# Patient Record
Sex: Female | Born: 1990 | Race: White | Hispanic: Yes | Smoking: Never smoker
Health system: Southern US, Community
[De-identification: ages and names within clinical notes are randomized; demographics above are authoritative.]

## PROBLEM LIST (undated history)

## (undated) DIAGNOSIS — N946 Dysmenorrhea, unspecified: Secondary | ICD-10-CM

## (undated) DIAGNOSIS — N970 Female infertility associated with anovulation: Secondary | ICD-10-CM

## (undated) DIAGNOSIS — K5909 Other constipation: Secondary | ICD-10-CM

## (undated) DIAGNOSIS — E282 Polycystic ovarian syndrome: Secondary | ICD-10-CM

## (undated) DIAGNOSIS — E781 Pure hyperglyceridemia: Secondary | ICD-10-CM

## (undated) HISTORY — DX: Dysmenorrhea, unspecified: N94.6

## (undated) HISTORY — DX: Female infertility associated with anovulation: N97.0

## (undated) HISTORY — DX: Polycystic ovarian syndrome: E28.2

## (undated) HISTORY — DX: Other constipation: K59.09

## (undated) HISTORY — DX: Pure hyperglyceridemia: E78.1

---
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
## 2008-02-25 ENCOUNTER — Ambulatory Visit: Payer: Self-pay | Admitting: Family Medicine

## 2008-02-25 LAB — CONVERTED CEMR LAB
Bilirubin Urine: NEGATIVE
Nitrite: NEGATIVE
Protein, U semiquant: NEGATIVE
Urobilinogen, UA: 1
WBC Urine, dipstick: NEGATIVE

## 2008-02-26 LAB — CONVERTED CEMR LAB
Chlamydia, DNA Probe: NEGATIVE
GC Probe Amp, Genital: NEGATIVE

## 2008-02-29 ENCOUNTER — Encounter: Payer: Self-pay | Admitting: Family Medicine

## 2008-04-14 ENCOUNTER — Ambulatory Visit: Payer: Self-pay | Admitting: Family Medicine

## 2008-09-29 ENCOUNTER — Ambulatory Visit: Payer: Self-pay | Admitting: Family Medicine

## 2008-09-29 LAB — CONVERTED CEMR LAB
Glucose, Urine, Semiquant: NEGATIVE
Nitrite: NEGATIVE
Specific Gravity, Urine: <1.005
WBC Urine, dipstick: NEGATIVE
pH: 7

## 2008-09-30 ENCOUNTER — Encounter: Payer: Self-pay | Admitting: Family Medicine

## 2008-10-01 ENCOUNTER — Encounter: Payer: Self-pay | Admitting: Family Medicine

## 2009-01-12 ENCOUNTER — Ambulatory Visit: Payer: Self-pay | Admitting: Family Medicine

## 2009-01-12 LAB — CONVERTED CEMR LAB
Bilirubin Urine: NEGATIVE
Glucose, Urine, Semiquant: NEGATIVE
Ketones, urine, test strip: NEGATIVE
Protein, U semiquant: NEGATIVE
Specific Gravity, Urine: 1.02
Urobilinogen, UA: 0.2

## 2009-01-20 ENCOUNTER — Encounter: Payer: Self-pay | Admitting: Family Medicine

## 2009-01-20 ENCOUNTER — Ambulatory Visit: Payer: Self-pay | Admitting: Family Medicine

## 2009-01-20 LAB — CONVERTED CEMR LAB
Beta hcg, urine, semiquantitative: NEGATIVE
Rapid Strep: NEGATIVE

## 2009-03-11 HISTORY — PX: OTHER SURGICAL HISTORY: SHX169

## 2009-03-23 ENCOUNTER — Encounter: Payer: Self-pay | Admitting: Family Medicine

## 2009-03-23 ENCOUNTER — Ambulatory Visit: Payer: Self-pay | Admitting: Family Medicine

## 2009-03-23 LAB — CONVERTED CEMR LAB: Whiff Test: NEGATIVE

## 2009-04-11 ENCOUNTER — Emergency Department (HOSPITAL_COMMUNITY): Admission: EM | Admit: 2009-04-11 | Discharge: 2009-04-11 | Payer: Self-pay | Admitting: Emergency Medicine

## 2009-04-12 ENCOUNTER — Emergency Department (HOSPITAL_COMMUNITY): Admission: EM | Admit: 2009-04-12 | Discharge: 2009-04-12 | Payer: Self-pay | Admitting: Emergency Medicine

## 2009-04-27 ENCOUNTER — Ambulatory Visit: Payer: Self-pay | Admitting: Family Medicine

## 2009-05-15 ENCOUNTER — Ambulatory Visit: Payer: Self-pay | Admitting: Family Medicine

## 2009-05-15 LAB — CONVERTED CEMR LAB: Beta hcg, urine, semiquantitative: NEGATIVE

## 2009-07-14 ENCOUNTER — Ambulatory Visit: Payer: Self-pay | Admitting: Family Medicine

## 2009-07-14 DIAGNOSIS — N912 Amenorrhea, unspecified: Secondary | ICD-10-CM | POA: Insufficient documentation

## 2009-07-14 LAB — CONVERTED CEMR LAB
BUN: 10 mg/dL (ref 6–23)
Beta hcg, urine, semiquantitative: NEGATIVE
Chloride: 104 meq/L (ref 96–112)
Creatinine, Ser: 0.57 mg/dL (ref 0.40–1.20)
Glucose, Bld: 95 mg/dL (ref 70–99)
HCT: 42.6 % (ref 36.0–46.0)
Hemoglobin: 14.2 g/dL (ref 12.0–15.0)
MCHC: 33.3 g/dL (ref 30.0–36.0)
Potassium: 4.1 meq/L (ref 3.5–5.3)
RBC: 4.88 M/uL (ref 3.87–5.11)
RDW: 13.2 % (ref 11.5–15.5)

## 2009-07-15 ENCOUNTER — Encounter: Payer: Self-pay | Admitting: Family Medicine

## 2009-07-24 ENCOUNTER — Ambulatory Visit: Payer: Self-pay | Admitting: Family Medicine

## 2009-07-24 ENCOUNTER — Encounter: Payer: Self-pay | Admitting: Family Medicine

## 2009-08-11 ENCOUNTER — Ambulatory Visit: Payer: Self-pay | Admitting: Family Medicine

## 2009-08-11 DIAGNOSIS — N943 Premenstrual tension syndrome: Secondary | ICD-10-CM | POA: Insufficient documentation

## 2009-08-28 ENCOUNTER — Encounter: Payer: Self-pay | Admitting: *Deleted

## 2009-08-31 ENCOUNTER — Ambulatory Visit: Payer: Self-pay | Admitting: Family Medicine

## 2009-08-31 LAB — CONVERTED CEMR LAB
Blood in Urine, dipstick: NEGATIVE
Chlamydia, DNA Probe: NEGATIVE
Glucose, Urine, Semiquant: NEGATIVE
Ketones, urine, test strip: NEGATIVE
Protein, U semiquant: NEGATIVE
Specific Gravity, Urine: 1.015
Whiff Test: NEGATIVE
pH: 7

## 2009-09-02 ENCOUNTER — Telehealth: Payer: Self-pay | Admitting: Family Medicine

## 2009-10-19 ENCOUNTER — Ambulatory Visit: Payer: Self-pay | Admitting: Family Medicine

## 2009-11-30 ENCOUNTER — Ambulatory Visit: Payer: Self-pay | Admitting: Family Medicine

## 2010-02-09 ENCOUNTER — Ambulatory Visit: Payer: Self-pay | Admitting: Family Medicine

## 2010-02-09 DIAGNOSIS — R351 Nocturia: Secondary | ICD-10-CM

## 2010-02-09 LAB — CONVERTED CEMR LAB
FSH: 6 milliintl units/mL
Prolactin: 2.6 ng/mL
TSH: 0.612 microintl units/mL (ref 0.350–4.500)

## 2010-02-10 ENCOUNTER — Ambulatory Visit (HOSPITAL_COMMUNITY): Admission: RE | Admit: 2010-02-10 | Discharge: 2010-02-10 | Payer: Self-pay | Admitting: Family Medicine

## 2010-02-12 ENCOUNTER — Encounter: Payer: Self-pay | Admitting: Family Medicine

## 2010-02-12 LAB — CONVERTED CEMR LAB: Pap Smear: NEGATIVE

## 2010-05-12 NOTE — Assessment & Plan Note (Signed)
Summary: F/U/KH   Vital Signs:  Patient profile:   20 year old female Menstrual status:  irregular Height:      60.75 inches Weight:      126 pounds BMI:     24.09 Temp:     98.0 degrees F oral Pulse rate:   69 / minute BP sitting:   103 / 66  (left arm) Cuff size:   regular  Vitals Entered By: Tessie Fass CMA (Aug 11, 2009 10:55 AM) CC: F/U   Primary Care Provider:  Zachery Dauer MD  CC:  F/U.  History of Present Illness: Visit conducted in [place].  Traci Porter remarks that she is much better from the headache and cold symptoms for which she was seen here recently.  At present she wishes to discuss premenstrual pain that comeson about 1 week prior to menses.  LMP April 22, lasted its usual 3 days (one day heavy flow, then lighter).  Prior to that menses, penultimate menses was Jan 25th.  Usually her menses are very irregular. Has always had a lot of cramping and pelvic pain in the days prior to menses, which resolve after first day of menses.  She reprts that her mood is better than at last visit, when her PHQ-9 score was a 5.     Current Medications (verified): 1)  Midrin 325-65-100 Mg Caps (Apap-Isometheptene-Dichloral) .... Tomar 1 -2 Capsules Every 4 Hours As Needed For Headache. 2)  Naproxen 500 Mg Tabs (Naproxen) .... Sig: Take 1 Tab By Mouth Every 12 Hours As Directed Instructions in Spanish  Allergies (verified): No Known Drug Allergies  Physical Exam  General:  Well-developed,well-nourished,in no acute distress; alert,appropriate and cooperative throughout examination Ears:  External ear exam shows no significant lesions or deformities.  Otoscopic examination reveals clear canals, tympanic membranes are intact bilaterally without bulging, retraction, inflammation or discharge. Hearing is grossly normal bilaterally. Nose:  External nasal examination shows no deformity or inflammation. Nasal mucosa are pink and moist without lesions or exudates. Mouth:  Oral mucosa and  oropharynx without lesions or exudates.  Teeth in good repair. Neck:  No deformities, masses, or tenderness noted.   Impression & Recommendations:  Problem # 1:  PREMENSTRUAL DYSPHORIC SYNDROME (ICD-625.4)  Discussed use of NSAIDS for the week prior to onset of menses.  She may begin to take at first sign of premenstrual cramping and pain.  Stop taking after first day of menses.  She agrees with this plan, will call if not improved after trying for 2 cycles.  Also to consider SSRI if NSAIDs not helpful, she is amenable to this idea and will call to discuss if needed.   The following medications were removed from the medication list:    Ultracet 37.5-325 Mg Tabs (Tramadol-acetaminophen) .Marland Kitchen... Take 1 -2 tabs every 4 hr as needed for severe headache Her updated medication list for this problem includes:    Midrin 325-65-100 Mg Caps (Apap-isometheptene-dichloral) .Marland Kitchen... Tomar 1 -2 capsules every 4 hours as needed for headache.    Naproxen 500 Mg Tabs (Naproxen) ..... Sig: take 1 tab by mouth every 12 hours as directed instructions in spanish  Orders: [facility]- Est Level  3 ([address]295)  Complete Medication List: 1)  Midrin 325-65-100 Mg Caps (Apap-isometheptene-dichloral) .... Tomar 1 -2 capsules every 4 hours as needed for headache. 2)  Naproxen 500 Mg Tabs (Naproxen) .... Sig: take 1 tab by mouth every 12 hours as directed instructions in spanish  Patient Instructions: 1)  Fue un placer verle  hoy.  2)  Mande' una receta para Naproxen 500mg , tome una tableta cada 12 horas comenzando en el primer dia de dolor de vientre antes de caer la menstruacion. Siga tomandola Designer, multimedia dia de la menstruacion.  Tomela con algo de comer.  Tome muchos liquidos con la medicina tambien. 3)  Si no se le mejora, por favor llame y podemos considerar otras alternativas.  Prescriptions: NAPROXEN 500 MG TABS (NAPROXEN) SIG: Take 1 tab by mouth every 12 hours as directed Instructions in Spanish  #60 x 2    Entered and Authorized by:   Paula Compton MD   Signed by:   Paula Compton MD on 08/11/2009   Method used:   Electronically to        [facility] [phone]* (retail)       [address]       [place], [place]  [address]       Ph: [phone]       Fax: [phone]   RxID:   [id number]

## 2010-05-12 NOTE — Letter (Signed)
Summary: Generic Letter  [facility]  [address]   [place], [place] [address]   Phone: [phone]  Fax: [phone]    07/15/2009  [facility] [address] [place], [place]  [address]  Iantha Fallen,   Espero que esta carta le encuentre bien.  Escribo para decirle que todos los laboratorios que hicimos ayer salieron normales.    Por favor hagame contacto con cualquier pregunta o problema.     Sinceramente,     Paula Compton MD  Appended Document: Generic Letter mailed.

## 2010-05-12 NOTE — Assessment & Plan Note (Signed)
Summary: PE,PAP, irreg menses/mj   Vital Signs:  Patient profile:   20 year old female Menstrual status:  irregular Height:      60.75 inches Weight:      134.2 pounds BMI:     25.66 Pulse rate:   72 / minute BP sitting:   109 / 71  (right arm)  Vitals Entered By: Arlyss Repress CMA, (February 09, 2010 1:34 PM) CC: physical with pap Is Patient Diabetic? No Pain Assessment Patient in pain? no        CC:  physical with pap.  Habits & Providers  Alcohol-Tobacco-Diet     Tobacco Status: never  Allergies: No Known Drug Allergies   Complete Medication List: 1)  Miralax Powd (Polyethylene glycol 3350) .... Take 1 scoop (17 g) a day prn constipation. (spanish instructions) 2)  Multivitamins Tabs (Multiple vitamin) .... Take one tablet daily  Other Orders: Pap Smear-[facility] ([id number]) [facility]- Est Level  3 ([address]) U Preg-[facility] ([address]) A1C-[facility] ([address]) FSH-[facility] ([id number]) TSH-[facility] [phone]) Prolactin-[facility] [phone]) Ultrasound (Ultrasound)  Patient Instructions: 1)  Voy a contactarle con los resultados de las pruebas de [place].   Orders Added: 1)  Pap Smear-[facility] [[id number]] 2)  [facility]- Est Level  3 [[address]] 3)  U Preg-[facility] [81025] 4)  A1C-[facility] [83036] 5)  FSH-[facility] [[id number]] 6)  TSH-[facility] [[id number]] 7)  Prolactin-[facility] [[id number]] 8)  Ultrasound [Ultrasound]    Laboratory Results   Urine Tests  Date/Time Received: February 09, 2010 2:16 PM  Date/Time Reported: February 09, 2010 2:35 PM     Urine HCG: negative Comments: ...............test performed by......Marland KitchenBonnie A. [place], MLS (ASCP)cm   Blood Tests   Date/Time Received: February 09, 2010 2:16 PM  Date/Time Reported: February 09, 2010 3:18 PM   HGBA1C: 5.3%   (Normal Range: Non-Diabetic - 3-6%   Control Diabetic - 6-8%)  Comments: ...............test performed by......Marland KitchenBonnie A. [place], MLS (ASCP)cm      Phone Note Outgoing Call   Call placed by: Zachery Dauer MD,   February 10, 2010 5:25 PM Summary of Call: I informed Traci Porter in Spanish that there are no cysts on her ovaries and no lab test abnormalities to prevent a pregnancy. She confirms that she is not taking Citalopram but is taking  a multivitamin. She will return if continued menstual abnormalities.  Initial call taken by: Zachery Dauer MD,  February 10, 2010 5:27 PM  New Problems: NOCTURIA [phone]) SCREENING FOR MALIGNANT NEOPLASM OF THE CERVIX (ICD-V76.2)   New Problems: NOCTURIA (ICD-788.43) SCREENING FOR MALIGNANT NEOPLASM OF THE CERVIX (ICD-V76.2)

## 2010-05-12 NOTE — Miscellaneous (Signed)
Summary: r shoulder pain  Clinical Lists Changes c/o r shoulder pain x 3 days. using ibu. appt in [facility] mon at 3.used interpretor...Marland KitchenMarland KitchenGolden Circle RN  Aug 28, 2009 2:05 PM

## 2010-05-12 NOTE — Assessment & Plan Note (Signed)
Summary: ER follow-up   Vital Signs:  Patient profile:   20 year old female Menstrual status:  irregular Height:      60.75 inches Weight:      126 pounds BMI:     24.09 BSA:     1.55 Temp:     98.4 degrees F Pulse rate:   80 / minute BP sitting:   112 / 74  Vitals Entered By: Jone Baseman CMA (April 27, 2009 2:43 PM) CC: Pap Smear Is Patient Diabetic? No Pain Assessment Patient in pain? yes     Location: back Intensity: 4   Primary Care Provider:  Zachery Dauer MD  CC:  Pap Smear.  History of Present Illness: 1. ? pap smear Was told it was time for her pap smear. Doesn't know what the pap smear is for. Last pap was 7/10 and normal. + sexually active  2. back pain Was seen in ED 04/11/09 with HA and fever. LP performed and negative for meningitis (this visit is reviewed today). Since then she has had intermittent back pain. Rates at 3-4 when it occurs. Occurs with bending forward and standing on her feet for long periods. Has taken ibuprofen without much relief of her symptoms.  ROS: + irregular periods (spots in between periods, has seen Dr. Mauricio Po for this), last period in December; no urinary or bowel problems.   Habits & Providers  Alcohol-Tobacco-Diet     Tobacco Status: never  Current Medications (verified): 1)  Midrin 325-65-100 Mg Caps (Apap-Isometheptene-Dichloral) .... Tomar 1 -2 Capsules Every 4 Hours As Needed For Headache. 2)  Multivitamins  Tabs (Multiple Vitamin) .... Take One Tablet Daily 3)  Ultracet 37.5-325 Mg Tabs (Tramadol-Acetaminophen) .... Take 1 -2 Tabs Every 4 Hr As Needed For Severe Headache  Allergies (verified): No Known Drug Allergies  Family History: no family history of cancer  Social History: Native of [place], [place] moved to [place] to join mother, [name] 2004 Lives with her BF (not FOB) and 3 yo son.  Education: 5 years in [place] classes in [place]. Stopped after 10th grade due to pregnancy - no plans on  finishing.  Catholic faith.  Lives with Ruffin Pyo (07/28/06), brother, and sister-in-law.  FOB not involved.   She has 5 brothers, one of which was killed by gang in [place] (1/08)  Physical Exam  Additional Exam:  General:  Vital signs reviewed -- normal Alert, appropriate; well-dressed and well-nourished Lungs:  work of breathing unlabored, clear to auscultation bilaterally; no wheezes, rales, or ronchi; good air movement throughout Heart:  regular rate and rhythm, no murmurs; normal s1/s2 Abd: +BS, soft, non-tender, non-distended; no masses; no rebound or guarding  Pulses:  DP and radial pulses 2+ bilaterally  Extremities:  no cyanosis, clubbing, or edema Neurologic:  alert and oriented. speech normal. MSK: back exam: normal to inspection - no signs of trauma, infection -- cannot find previous LP site; some TTP over lumber spine; mild lumbar paraspinous muscle tenderness; normal lateral flexion, normal forward flexion, normal extension; some pain at the extent of each of thesenegative SLR bilaterally. Gait is non-antalgic. Normal with transfers.     Impression & Recommendations:  Problem # 1:  LOW BACK PAIN, CHRONIC (ICD-724.2) Assessment Deteriorated  Exacerbated by recent LP. Will refill ultracet and recommend heat. no red flags. Return parameters discussed.  Patient/family agreeable. See instructions.  Her updated medication list for this problem includes:    Ultracet 37.5-325 Mg Tabs (Tramadol-acetaminophen) .Marland Kitchen... Take 1 -  2 tabs every 4 hr as needed for severe headache  Orders: [facility]- Est Level  3 ([address])  Problem # 2:  Preventive Health Care (ICD-V70.0) Assessment: Comment Only counseled that she doesn't need a pap until age 64. Also educated on purpose of pap smears. No family history of cancer.   Complete Medication List: 1)  Midrin 325-65-100 Mg Caps (Apap-isometheptene-dichloral) .... Tomar 1 -2 capsules every 4 hours as needed for headache. 2)  Multivitamins Tabs  (Multiple vitamin) .... Take one tablet daily 3)  Ultracet 37.5-325 Mg Tabs (Tramadol-acetaminophen) .... Take 1 -2 tabs every 4 hr as needed for severe headache  Patient Instructions: 1)  tome la medicina cada 4 o 6 horas para dolor 2)  aplique calor en la espalda 3 veces al dia por 20 minutos 3)  regrese aqui si su dolor no esta mejor en una o dos semanas Prescriptions: ULTRACET 37.5-325 MG TABS (TRAMADOL-ACETAMINOPHEN) Take 1 -2 tabs every 4 hr as needed for severe headache  #30 x 0   Entered by:   Myrtie Soman  MD   Authorized by:   Zachery Dauer MD   Signed by:   Myrtie Soman  MD on 04/27/2009   Method used:   Electronically to        [facility]. [phone]* (retail)       [address]       [place], [place]  [address]       Ph: [phone]       Fax: [phone]   RxID:   [id number]    Prevention & Chronic Care Immunizations   Influenza vaccine: Fluvax Non-MCR  (01/20/2009)    Tetanus booster: Not documented    Pneumococcal vaccine: Not documented  Other Screening   Pap smear: normal per patient  (10/10/2007)   Pap smear due: 10/09/2008   Smoking status: never  (04/27/2009)

## 2010-05-12 NOTE — Miscellaneous (Signed)
Summary: c/o flu sysmptoms  Clinical Lists Changes using interpretor. pt c/o flu like symptoms. work in with Dr. Sharen Hones at 1:30.Golden Circle RN  July 24, 2009 9:11 AM

## 2010-05-12 NOTE — Assessment & Plan Note (Signed)
Summary: FATIGUE/KH   Vital Signs:  Patient profile:   20 year old female Menstrual status:  irregular Height:      60.75 inches Weight:      126 pounds BMI:     24.09 Temp:     97.9 degrees F oral Pulse rate:   83 / minute BP sitting:   103 / 70  (left arm) Cuff size:   regular  Vitals Entered By: Tessie Fass CMA (July 14, 2009 11:10 AM) CC: fatigue and lower back pain x 3 weeks Is Patient Diabetic? No Pain Assessment Patient in pain? yes     Location: lower back Intensity: 4   Primary Care Provider:  Zachery Dauer MD  CC:  fatigue and lower back pain x 3 weeks.  History of Present Illness: Visit conducted in [place].   Traci Porter comes in today with complaint of feeling very fatigued; while at rest, she will have brief spells of anxious desperation that are short-lived.  Will occur at night while at home.  Goes away without intervention.  Also complains of easy fatiguablity while walking with her son.  After 20 minutes, gets very tired and has to go home.  No chest pain, no cough , no coryza, no nausea/vomiting, no diarrhea.   Continues to have generalized abd/lower pelvic pain.  Has dyspareunia.  LMP in January, has had irregular menses for several months. Home pregnancy test 2 weeks ago was negative. Doesn;t have breast tenderness.  Has had worsening constipation, also feels cold when others do not.   Habits & Providers  Alcohol-Tobacco-Diet     Tobacco Status: never  Allergies: No Known Drug Allergies  Family History: no family history of cancer  July 14, 2009: No family hx of thyroid disease.   Social History: Native of [place], [place] moved to [place] to join mother, [name] 2004 Lives with her BF (not FOB) and 3 yo son.  Education: 5 years in [place] classes in [place]. Stopped after 10th grade due to pregnancy - no plans on finishing.  Catholic faith.  Lives with Ruffin Pyo (07/28/06), brother, and sister-in-law.  FOB not involved.   She has 5  brothers, one of which was killed by gang in [place]1/08) July 14, 2009: Nonsmoker, nondrinker.   Physical Exam  General:  Well appearing, no apparent distress. Bright affect, readily answers questions.  Eyes:  Clear sclerae and conjunctivae. PERRL Mouth:  Oral mucosa and oropharynx without lesions or exudates.  Teeth in good repair. Neck:  No deformities, masses, or tenderness noted. Thyroid palpated, without nodules Lungs:  Normal respiratory effort, chest expands symmetrically. Lungs are clear to auscultation, no crackles or wheezes. Heart:  Normal rate and regular rhythm. S1 and S2 normal without gallop, murmur, click, rub or other extra sounds. Abdomen:  Bowel sounds positive,abdomen soft and non-tender without masses, organomegaly or hernias noted.   Impression & Recommendations:  Problem # 1:  TIREDNESS (ICD-780.79) Vague complaints, I am more suspicous of mood disorder than organic cause.  Will check UPreg, TSH, as well.  PHQ-9 in Spanish for possible mood disorder.  Scores 5 (zero pts for question #9) Orders: U Preg-[facility] ([address]) CBC-[facility] ([address]) Basic Met-[facility] [phone]) TSH-[facility] [phone])  Problem # 2:  ABSENCE OF MENSTRUATION (ICD-626.0) LMP January 2011, since then has not had menses.  Does not believe she is likely to be pregnant.  Last home pregnancy test 2 weeks ago was negative.  Other testing aimed at looking for hyperandrogenism, impaired  glucose, thyroid disease, prolactinoma.  Orders: U Preg-[facility] [id number]) Basic Met-[facility] [phone]) TSH-[facility] [phone]) Testosterone-[facility] [id number]) Prolactin-[facility] [phone])  Complete Medication List: 1)  Midrin 325-65-100 Mg Caps (Apap-isometheptene-dichloral) .... Tomar 1 -2 capsules every 4 hours as needed for headache. 2)  Multivitamins Tabs (Multiple vitamin) .... Take one tablet daily 3)  Ultracet 37.5-325 Mg Tabs (Tramadol-acetaminophen) .... Take 1 -2 tabs every 4 hr as needed for severe headache 4)   Meclizine Hcl 25 Mg Tabs (Meclizine hcl) .... Sig take 1 tab by mouth every 6 to 8 hours as needed for dizziness. instructions in spanish  Patient Instructions: 1)  Fue un placer verle hoy.  2)  Estamos chequeando varios laboratorios para ver si se puede explicar la razon por el cansancio que tiene.  3)  Para el estrenimiento, recomiendo que [employer] y que tome bastante agua todos los dias.  4)  PLEASE MAKE APPT FOR PATIENT WITH DR Mauricio Po IN THE COMING 3 TO 4 WEEKS.  Laboratory Results   Urine Tests  Date/Time Received: July 14, 2009 11:35 AM  Date/Time Reported: July 14, 2009 11:41 AM     Urine HCG: negative Comments: ...........test performed by...........Marland KitchenTerese Door, CMA     Appended Document: FATIGUE/KH    Clinical Lists Changes  Orders: Added new Test order of [facility]- Est Level  3 ([address]) - Signed

## 2010-05-12 NOTE — Assessment & Plan Note (Signed)
Summary: dizziness   Vital Signs:  Patient profile:   20 year old female Menstrual status:  irregular Weight:      127.9 pounds Pulse rate:   76 / minute BP sitting:   103 / 68  (left arm)  Vitals Entered By: Arlyss Repress CMA, (May 15, 2009 2:20 PM) CC: dizziness x 1 week Is Patient Diabetic? No Pain Assessment Patient in pain? no        Primary Care Provider:  Zachery Dauer MD  CC:  dizziness x 1 week.  History of Present Illness: Visit conducted in [place].  Patient presents with complaint of intermittent dizziness and generalized fatigue/weakness, that has been present for about 8 days.  She reports that she started wtih what appeared to be a normal menstrual period on Jan 25th, one day of moderate bleeding, then became very light on day 2.  Started to experience weakness in both legs, as though they would give way.  She did not fall.  Began to experience dizziness, not associated with position change.  No cough or chills, no sore throat, no fevers.  No dysuria. Penultimate menses was in early December.  She occasionally skips  a month. Is not using any form of birth control or hormonal medications.  Does not believe she is likely to be pregnant.  Mild cough, no significantnasal discharge. No nausea/vomiting, no diarrhea.   Patient is G1P1.  Habits & Providers  Alcohol-Tobacco-Diet     Tobacco Status: never  Current Medications (verified): 1)  Midrin 325-65-100 Mg Caps (Apap-Isometheptene-Dichloral) .... Tomar 1 -2 Capsules Every 4 Hours As Needed For Headache. 2)  Multivitamins  Tabs (Multiple Vitamin) .... Take One Tablet Daily 3)  Ultracet 37.5-325 Mg Tabs (Tramadol-Acetaminophen) .... Take 1 -2 Tabs Every 4 Hr As Needed For Severe Headache 4)  Meclizine Hcl 25 Mg Tabs (Meclizine Hcl) .... Sig Take 1 Tab By Mouth Every 6 To 8 Hours As Needed For Dizziness. Instructions in Spanish  Allergies (verified): No Known Drug Allergies  Physical Exam  General:   Well-developed,well-nourished,in no acute distress; alert,appropriate and cooperative throughout examination Eyes:  No corneal or conjunctival inflammation noted. EOMI. Perrla. Funduscopic exam benign, without hemorrhages, exudates or papilledema. Vision grossly normal. Ears:  External ear exam shows no significant lesions or deformities.  Otoscopic examination reveals clear canals, tympanic membranes are intact bilaterally without bulging, retraction, inflammation or discharge. Hearing is grossly normal bilaterally. Nose:  External nasal examination shows no deformity or inflammation. Nasal mucosa are pink and moist without lesions or exudates. No sinus tenderness Mouth:  Oral mucosa and oropharynx without lesions or exudates.  Teeth in good repair. Neck:  No deformities, masses, or tenderness noted. Lungs:  Normal respiratory effort, chest expands symmetrically. Lungs are clear to auscultation, no crackles or wheezes. Heart:  Normal rate and regular rhythm. S1 and S2 normal without gallop, murmur, click, rub or other extra sounds. Neurologic:  Dix-Hallpike produces dizziness with head to R.  Horizontal nystagmus noted.   Impression & Recommendations:  Problem # 1:  DIZZINESS (ICD-780.4) Patient with one week of dizziness, fatigue, Dix-Hallpike that lateralizes to the R.  Suspect BPPV, other peripheral labyrinthitis such as that caused by transient viral syndrome.  Reassurance, possible useof meclizine as needed fordizziness.  Warning aboutpossible drowsiness with the meclizine.  Her updated medication list for this problem includes:    Meclizine Hcl 25 Mg Tabs (Meclizine hcl) ..... Sig take 1 tab by mouth every 6 to 8 hours as needed for dizziness.  instructions in spanish  Orders: U Preg-[facility] ([address]) [facility]- Est Level  3 ([address])  Complete Medication List: 1)  Midrin 325-65-100 Mg Caps (Apap-isometheptene-dichloral) .... Tomar 1 -2 capsules every 4 hours as needed for headache. 2)  Multivitamins  Tabs (Multiple vitamin) .... Take one tablet daily 3)  Ultracet 37.5-325 Mg Tabs (Tramadol-acetaminophen) .... Take 1 -2 tabs every 4 hr as needed for severe headache 4)  Meclizine Hcl 25 Mg Tabs (Meclizine hcl) .... Sig take 1 tab by mouth every 6 to 8 hours as needed for dizziness. instructions in spanish  Patient Instructions: 1)  Fue un placer verle Adult nurse. 2)  Creo que los [employer] y Environmental health practitioner que siente se deben a una infeccion viral que no requiere de TEFL teacher.  3)  Para el mareo, le estoy recetando una medicina que se llama de Meclizine 25mg .  Se debe tomar una tableta cada 6 a 8 horas segun necesite para el mareo.  Esta medicina puede provocar sueno, asi que tenga cuidado si la va a tomar fuera de la casa.  4)  Si no se mejora, o si los sintomas empeoran, por favor llameme al [phone]. Prescriptions: MECLIZINE HCL 25 MG TABS (MECLIZINE HCL) SIG Take 1 tab by mouth every 6 to 8 hours as needed for dizziness. Instructions in Spanish  #12 x 0   Entered and Authorized by:   Paula Compton MD   Signed by:   Paula Compton MD on 05/15/2009   Method used:   Electronically to        [facility] [phone]* (retail)       [address]       [place], [place]  [address]       Ph: [phone]       Fax: [phone]   RxID:   [id number]   Laboratory Results   Urine Tests  Date/Time Received: May 15, 2009 2:49 PM  Date/Time Reported: May 15, 2009 2:55 PM     Urine HCG: negative Comments: ...........test performed by...........Marland KitchenTerese Door, CMA

## 2010-05-12 NOTE — Assessment & Plan Note (Signed)
Summary: flu symptoms/Traci Porter/Hale   Vital Signs:  Patient profile:   20 year old female Menstrual status:  irregular Weight:      125 pounds Temp:     98.4 degrees F oral Pulse rate:   80 / minute BP sitting:   114 / 72  (right arm)  Vitals Entered By: Arlyss Repress CMA, (July 24, 2009 1:37 PM) CC: head aches, n/v/d x 5 days. Is Patient Diabetic? No Pain Assessment Patient in pain? no        Primary Care Provider:  Zachery Dauer MD  CC:  head aches and n/v/d x 5 days.Marland Kitchen  History of Present Illness: CC:   4d h/o feeling ill.  monday woke up with hoarseness, HA, sneezing.  Tuesday mostly resolved, but started HA, vomiting and diarrhea.  Vomited x 2, NBNB.  DIarrhea about 4-5 times a day.  No blood in stool.  No nausea.  h/o HA in past, feels like HA not as severe as ones she's had in past.  Still with coughing and sneezing and + RN, but not much congestion.  No sore throat or ear pain.  No abd pain or chest pain.  No fever.  Has tried theraflu for cough.  Main complaint is diarrhea.  No skin rash, no myalgia/arthralgias  22yo child at home, no daycare.  No new foods or restaurant food.  No recent travel.  AFVSS.   Habits & Providers  Alcohol-Tobacco-Diet     Tobacco Status: never  Allergies (verified): No Known Drug Allergies  Past History:  Past medical, surgical, family and social histories (including risk factors) reviewed for relevance to current acute and chronic problems.  Past Medical History: Reviewed history from 07/02/2007 and no changes required. SVD  07/28/06 Hemoglobin normal - 02/10/2004 V.A. and hearing screen normal - 02/10/2004  Past Surgical History: Reviewed history from 09/29/2008 and no changes required. Implanon removed 12/10  Family History: Reviewed history from 07/14/2009 and no changes required. no family history of cancer  July 14, 2009: No family hx of thyroid disease.   Social History: Reviewed history from 07/14/2009 and no changes  required. Native of [place], [place] moved to [place] to join mother, Traci Porter 2004 Lives with her BF (not FOB) and 22 yo son.  Education: 5 years in [place] classes in [place]. Stopped after 10th grade due to pregnancy - no plans on finishing.  Catholic faith.  Lives with Ruffin Pyo (07/28/06), brother, and sister-in-law.  FOB not involved.   She has 5 brothers, one of which was killed by gang in [place] (1/08) July 14, 2009: Nonsmoker, nondrinker.   Physical Exam  General:  Well appearing, no apparent distress. nontoxic.  AFVSS Head:  Normocephalic and atraumatic without obvious abnormalities. No apparent alopecia or balding. Eyes:  Clear sclerae and conjunctivae. PERRL Ears:  External ear exam shows no significant lesions or deformities.  Otoscopic examination reveals clear canals, tympanic membranes are intact bilaterally without bulging, retraction, inflammation or discharge. Hearing is grossly normal bilaterally. Nose:  External nasal examination shows no deformity or inflammation. Nasal mucosa are pink and moist without lesions or exudates. No sinus tenderness Mouth:  Oral mucosa and oropharynx without lesions or exudates.  Teeth in good repair. Neck:  No deformities, masses, or tenderness noted. Lungs:  Normal respiratory effort, chest expands symmetrically. Lungs are clear to auscultation, no crackles or wheezes. Heart:  Normal rate and regular rhythm. S1 and S2 normal without gallop, murmur, click, rub or other extra  sounds. Abdomen:  Bowel sounds positive,abdomen soft and non-tender without masses, organomegaly or hernias noted. Msk:  right ankle without swelling, Mildly tender dorsum, range of motion normal Pulses:  2+ periph pulses Extremities:  No clubbing, cyanosis, edema, or deformity noted with normal full range of motion of all joints.   Skin:  Intact without suspicious lesions or rashes   Impression & Recommendations:  Problem # 1:  GASTROENTERITIS,  VIRAL (ICD-008.8)  Sounds like viral infection.  reassured, red flags to return discussed.  maintain adequate hydration and plenty of rest.  Wash hands.  Orders: [facility]- Est Level  3 ([address]213)  Complete Medication List: 1)  Midrin 325-65-100 Mg Caps (Apap-isometheptene-dichloral) .... Tomar 1 -2 capsules every 4 hours as needed for headache. 2)  Multivitamins Tabs (Multiple vitamin) .... Take one tablet daily 3)  Ultracet 37.5-325 Mg Tabs (Tramadol-acetaminophen) .... Take 1 -2 tabs every 4 hr as needed for severe headache 4)  Meclizine Hcl 25 Mg Tabs (Meclizine hcl) .... Sig take 1 tab by mouth every 6 to 8 hours as needed for dizziness. instructions in spanish 5)  Imodium A-d 2 Mg Tabs (Loperamide hcl) .... Take one every 4-6 hours as needed diarrhea.  label in spanish.  Patient Instructions: 1)  Parece como infeccion viral.  Puede que sea gastroenteritis. 2)  Asegurese de tomar mucho liquido y Lawyer. 3)  Puede tratar immodium para mejorar la diarrhea. 4)  Llame a clinica con preguntas. 5)  Regresar antes si tiene alta fiebre, si no puede tolerar liquidos, y si ve sangre en vomito o diarrhea. Prescriptions: IMODIUM A-D 2 MG TABS (LOPERAMIDE HCL) take one every 4-6 hours as needed diarrhea.  label in spanish.  #30 x 0   Entered and Authorized by:   Eustaquio Boyden  MD   Signed by:   Eustaquio Boyden  MD on 07/24/2009   Method used:   Print then Give to Patient   RxID:   [id number] IMODIUM A-D 2 MG TABS (LOPERAMIDE HCL) take one every 4-6 hours as needed diarrhea.  label in spanish.  #30 x 0   Entered and Authorized by:   Eustaquio Boyden  MD   Signed by:   Eustaquio Boyden  MD on 07/24/2009   Method used:   Electronically to        [facility] [phone]* (retail)       [address]       [place], [place]  [address]       Ph: [phone]       Fax: [phone]   RxID:   [id number]

## 2010-05-12 NOTE — Letter (Signed)
Summary: Results Follow-up Letter  [facility]  [address]   [place], [place] [address]   Phone: [phone]  Fax: [phone]    02/12/2010  [address] [place], [place]  [address]  Dear Ms. HERNANDEZ,   The following are the results of your recent test(s):  Test     Result     Pap Smear    Normal_______  Not Normal_____       Comments: _________________________________________________________ Cholesterol LDL(Bad cholesterol):          Your goal is less than:         HDL (Good cholesterol):        Your goal is more than: _________________________________________________________ Other Tests:   _________________________________________________________  Please call for an appointment Or _________________________________________________________ _________________________________________________________ _________________________________________________________  Sincerely,  Zachery Dauer MD [facility]

## 2010-05-12 NOTE — Assessment & Plan Note (Signed)
Summary: f/u/kh   Vital Signs:  Patient profile:   20 year old female Menstrual status:  irregular Height:      60.75 inches Weight:      134.1 pounds BMI:     25.64 Temp:     98.7 degrees F oral Pulse rate:   70 / minute Pulse rhythm:   regular BP sitting:   113 / 74  (left arm) Cuff size:   regular  Vitals Entered By: Loralee Pacas CMA (November 30, 2009 1:43 PM) CC: follow-up visit, stomach pain   Primary Care Provider:  Zachery Dauer MD  CC:  follow-up visit and stomach pain.  History of Present Illness: Her constipation and hard stools has resolved and she is no longer taking the Miralax. she is eating more fruit and vegetables. Eats corn flakes for breakfast.   Continues to feel like she angers easilty despite no relationship problems. Full time mother. Her own mother works. She doesn't recall any trauma on her trip to the [place] from [place]  Not using birth control and would like a pregnacy  Habits & Providers  Alcohol-Tobacco-Diet     Tobacco Status: never  Current Medications (verified): 1)  Citalopram Hydrobromide 10 Mg Tabs (Citalopram Hydrobromide) .... Take 1 Tablet For 2 Weeks, Then Increase To 2 Tablets Every Day (Spanish Instructions 2)  Miralax  Powd (Polyethylene Glycol 3350) .... Take 1 Scoop (17 G) A Day Prn Constipation. (Spanish Instructions) 3)  Multivitamins  Tabs (Multiple Vitamin) .... Take One Tablet Daily  Allergies (verified): No Known Drug Allergies  Physical Exam  General:  Well-developed,well-nourished,in no acute distress; alert,appropriate and cooperative throughout examination Psych:  Oriented X3, normally interactive, good eye contact, not anxious appearing, and not depressed appearing.    PHQ9 score low   Impression & Recommendations:  Problem # 1:  RECTAL BLEEDING, HX OF (ICD-V12.79) No further bleeding  Problem # 2:  CONSTIPATION, CHRONIC (ICD-564.09) Improved Her updated medication list for this problem  includes:    Miralax Powd (Polyethylene glycol 3350) .Marland Kitchen... Take 1 scoop (17 g) a day prn constipation. (spanish instructions)  Orders: [facility]- Est Level  3 ([address]244)  Problem # 3:  ANXIETY (ICD-300.00) Irritability continues Her updated medication list for this problem includes:    Citalopram Hydrobromide 10 Mg Tabs (Citalopram hydrobromide) .Marland Kitchen... Take 1 tablet for 2 weeks, then increase to 2 tablets every day (spanish instructions  Orders: [facility]- Est Level  3 (01027)  Complete Medication List: 1)  Citalopram Hydrobromide 10 Mg Tabs (Citalopram hydrobromide) .... Take 1 tablet for 2 weeks, then increase to 2 tablets every day (spanish instructions 2)  Miralax Powd (Polyethylene glycol 3350) .... Take 1 scoop (17 g) a day prn constipation. (spanish instructions) 3)  Multivitamins Tabs (Multiple vitamin) .... Take one tablet daily  Other Orders: Tdap => 60yrs IM ([address]) Admin 1st Vaccine (44034) Admin 1st Vaccine [facility]) [phone])  Patient Instructions: 1)  Please schedule a follow-up appointment in 6 weeks.  2)  Continua con multivitamina para acido folico 3)  Deje de tomar Citalopram si se embaraza   Tetanus/Td Vaccine    Vaccine Type: Tdap    Site: left deltoid    Mfr: GlaxoSmithKline    Dose: 0.5 ml    Route: IM    Given by: Arlyss Repress CMA,    Exp. Date: 12/31/2011    Lot #: [id number]    VIS given: 02/27/07 version given November 30, 2009.

## 2010-05-12 NOTE — Assessment & Plan Note (Signed)
Summary: R shoulder pain/Morrill/Hale   Vital Signs:  Patient profile:   20 year old female Menstrual status:  irregular Weight:      129.8 pounds Pulse rate:   74 / minute BP sitting:   111 / 75  (right arm)  Vitals Entered By: Arlyss Repress CMA, (Aug 31, 2009 3:56 PM) CC: right shoulder pain x 6 days. Is Patient Diabetic? No Pain Assessment Patient in pain? yes     Location: right shoulder Intensity: 4 Onset of pain  x 6 days.   Primary Care Provider:  Zachery Dauer MD  CC:  right shoulder pain x 6 days.Marland Kitchen  History of Present Illness: Visit conducted in [place]. Elanie comes in today wiht c/o pain along upper back, along the R shoulder.  Started last week around May 17th, while watching TV.  No trauma or strenuous exercise.  No pain to move R arm above her head or to use her R hand.  Took a few ibuprofen which helped for a little while, but pain came back.   Also complains of 2 weeks of vaginal discharge and itch.  This started soon after her partner was diagnosed with an STI; she is unable to give the name of his precise diagnosis.  He was treated already, but she fears she may have contracted it.  Denies fever or chills.  Does have dysuria and dyspareunia.     Habits & Providers  Alcohol-Tobacco-Diet     Tobacco Status: never  Allergies: No Known Drug Allergies  Physical Exam  General:  Well appearing, no apparent distress.  Neck:  neck supple.  Lungs:  Normal respiratory effort, chest expands symmetrically. Lungs are clear to auscultation, no crackles or wheezes. Heart:  Normal rate and regular rhythm. S1 and S2 normal without gallop, murmur, click, rub or other extra sounds. Abdomen:  soft, nontender nondistended.  Genitalia:  normal female external genitalia.  White vaginal discharge noted.  No mucosal lesions seen.  Mild tenderness along cervix with bimanual exam.  No true adnexal tenderness.  Msk:  reproducible pain to palpate along the area just below the R scapula.   Full active ROM of both shoulders.  Able to lift both arms above head symmetrically.  Internal rotation full and symmetric bilaterally  Neurologic:  Sensation in both hands grossly intact. Handgrip symmetric and full bilterally.    Impression & Recommendations:  Problem # 1:  BACK PAIN, ACUTE (ICD-724.5)  Muscular pain, not intra-articular.  Hot water bottles, NSAIDs, short course cyclobenzaprine at night with warnings.   The following medications were removed from the medication list:    Naproxen 500 Mg Tabs (Naproxen) ..... Sig: take 1 tab by mouth every 12 hours as directed instructions in spanish Her updated medication list for this problem includes:    Cyclobenzaprine Hcl 10 Mg Tabs (Cyclobenzaprine hcl) ..... Sig: take 1 tab by mouth at bedtime for 7 days spanish language instructions  Orders: [facility]- Est  Level 4 ([address])  Problem # 2:  SEXUALLY TRANSMITTED DISEASE, EXPOSURE TO (ICD-V01.6) Says her sexual partner was diagnosed wtih a genital infection, he was told it may be related to anal sex.  She has had vaginal discharge, dysuria over the past 2 weeks.  To treat with azithro 1g by mouth, as well as ceftriaxone 250mg  im today.  Wet prep, UA and cervical cultures today. HIV and RPR today at patient's request.  Orders: HIV-[facility] ([address]130-86578) RPR-[facility] (46962-95284) [facility]- Est  Level 4 ([address]244)  Complete Medication List: 1)  Cyclobenzaprine Hcl 10 Mg Tabs (Cyclobenzaprine hcl) .... Sig: take 1 tab by mouth at bedtime for 7 days spanish language instructions  Other Orders: Urinalysis-[facility] (00000) GC/Chlamydia-[facility] (87591/87491) Wet Prep- [facility] ([address]) Rocephin  250mg  (U0454) Azithromycin oral (U9811)  Patient Instructions: 1)  Fue un placer verle hoy. 2)  Creo que tiene un musculo lastimado en la espalda. Le recomiendo que tome ibuprofeno 600mg  cada 6 a 8 horas con algo de comer.  Puede tomar una tableta de 600mg , o tres tabletas de 200mg  (las que se venden sin receta). 3)  Una bolsa de  agua caliente varias veces por dia, para ayudar a que no se le ponga tiesa la espalda. 4)  Mande' una receta para un medicamento que relaja a los musculos (cyclobenzaprine 10mg ).  Tome una tableta cada noche antes de acostarse. Esta medicina provoca sueno, asi que recomiendo que no se use si va a manejar o si va a tomar alcohol. 5)  Por favor vuelva si no esta' mejor dentro de [employer].  Prescriptions: CYCLOBENZAPRINE HCL 10 MG TABS (CYCLOBENZAPRINE HCL) SIG: Take 1 tab by mouth at bedtime for 7 days Spanish language instructions  #10 x 0   Entered and Authorized by:   Paula Compton MD   Signed by:   Paula Compton MD on 08/31/2009   Method used:   Electronically to        [facility]. [phone]* (retail)       [address]       [place], [place]  [address]       Ph: [phone]       Fax: [phone]   RxID:   [id number]   Laboratory Results   Urine Tests  Date/Time Received: Aug 31, 2009 4:37 PM  Date/Time Reported: Aug 31, 2009 5:15 PM   Routine Urinalysis   Color: yellow Appearance: Clear Glucose: negative   (Normal Range: Negative) Bilirubin: negative   (Normal Range: Negative) Ketone: negative   (Normal Range: Negative) Spec. Gravity: 1.015   (Normal Range: 1.003-1.035) Blood: negative   (Normal Range: Negative) pH: 7.0   (Normal Range: 5.0-8.0) Protein: negative   (Normal Range: Negative) Urobilinogen: 0.2   (Normal Range: 0-1) Nitrite: negative   (Normal Range: Negative) Leukocyte Esterace: trace   (Normal Range: Negative)  Urine Microscopic WBC/HPF: 0-3 Bacteria/HPF: trace Epithelial/HPF: 0-5    Comments: ...............test performed by......Marland KitchenBonnie A. [place], MLS (ASCP)cm  Date/Time Received: Aug 31, 2009 4:37 PM  Date/Time Reported: Aug 31, 2009 4:57 PM   [employer] Source: vag WBC/hpf: 10-20 Bacteria/hpf: 3+  Rods Clue cells/hpf: none  Negative whiff Yeast/hpf: none Trichomonas/hpf:  none Comments: ...............test performed by......Marland KitchenBonnie A. [place], MLS (ASCP)cm     Medication Administration  Injection # 1:    Medication: Rocephin  250mg     Diagnosis: CONTACT OR EXPOSURE TO OTHER VIRAL DISEASES (ICD-V01.79)    Route: IM    Site: RUOQ gluteus    Exp Date: 03/11/2012    Lot #: [id number]    Mfr: sandoz    Patient tolerated injection without complications    Given by: Arlyss Repress CMA, (Aug 31, 2009 5:00 PM)  Medication # 1:    Medication: Azithromycin oral    Diagnosis: CONTACT OR EXPOSURE TO OTHER VIRAL DISEASES (ICD-V01.79)    Dose: 1gm    Route: po    Exp Date: 06/10/2010    Lot #: [id number]    Mfr: Francisca December  Patient tolerated medication without complications    Given by: Arlyss Repress CMA, (Aug 31, 2009 5:01 PM)  Orders Added: 1)  HIV-[facility] [[id number]] 2)  RPR-[facility] [phone] 3)  Urinalysis-[facility] [00000] 4)  GC/Chlamydia-[facility] [87591/87491] 5)  Wet Prep- [facility] [[address]] 6)  Rocephin  250mg  [J0696] 7)  Azithromycin oral [Q0144] 8)  [facility]- Est  Level 4 [13086]

## 2010-05-12 NOTE — Assessment & Plan Note (Signed)
Summary: stomach pain/colic/mj   Vital Signs:  Patient profile:   20 year old female Menstrual status:  irregular Height:      60.75 inches Weight:      133 pounds BMI:     25.43 Temp:     98.9 degrees F oral Pulse rate:   78 / minute BP sitting:   107 / 65  (right arm) Cuff size:   regular  Vitals Entered By: Tessie Fass CMA (October 19, 2009 4:28 PM) CC: anxiety and constipation Pain Assessment Patient in pain? yes     Location: abdomen Intensity: 5   Primary Care Provider:  Zachery Dauer MD  CC:  anxiety and constipation.  History of Present Illness: Interpretor present during entire visit.   Anxiety: Pt says she is having anxiety. She has times where she clinches her jaws, and gets headaches. She knows she is anxious during these times. She says she is happy most of the time and is getting along well with her family and boyfriend. Pt does have a history of a brother who died prior to coming to the [place].   Abdominal pain/constipation: Pt comes in with 2 weeks of worsening constipation. She has had constipation for years but is now having pain prior to every stooling. She is also having bleeding with her stool. Sometimes it colors the toilet water and sometimes it is a very small amount of blood. She says about 1 month ago she had a time  where she was going stool about 2 times a day which was unusual for her but she is now back to constipation. No history of hemorrhoids.    Current Medications (verified): 1)  Citalopram Hydrobromide 10 Mg Tabs (Citalopram Hydrobromide) .... Take 1 Tablet For 2 Weeks, Then Increase To 2 Tablets Every Day (Spanish Instructions 2)  Miralax  Powd (Polyethylene Glycol 3350) .... Take 1 Scoop (17 G) A Day For Constipation. (Spanish Instructions)  Allergies: No Known Drug Allergies  Review of Systems        vitals reviewed and pertinent negatives and positives seen in HPI   Physical Exam  General:  Well-developed,well-nourished,in no acute  distress; alert,appropriate and cooperative throughout examination Mouth:  Oral mucosa and oropharynx without lesions or exudates.  Teeth in good repair. Abdomen:  Bowel sounds positive,abdomen soft and non-tender without masses, organomegaly or hernias noted. Rectal:  No external abnormalities noted. Normal sphincter tone. No rectal masses or tenderness.no hemorrhoids on anoscopy. Psych:  Cognition and judgment appear intact. Alert and cooperative with normal attention span and concentration. No apparent delusions, illusions, hallucinations   Impression & Recommendations:  Problem # 1:  ANXIETY (ICD-300.00) Assessment New Pt admits to having anxiety. Plan to do PHQ9 in spanish at next visit. Pt will come back in 6 weeks for recheck on Celexa.   Her updated medication list for this problem includes:    Citalopram Hydrobromide 10 Mg Tabs (Citalopram hydrobromide) .Marland Kitchen... Take 1 tablet for 2 weeks, then increase to 2 tablets every day (spanish instructions  Orders: [facility]- Est  Level 4 ([address])  Problem # 2:  CONSTIPATION, CHRONIC (ICD-564.09) Assessment: Deteriorated Pt has worsening constipation. She is not eating any fruits or vegetables on a regular basis. Recommended fruits and vegetables. Added Mirilax.   Her updated medication list for this problem includes:    Miralax Powd (Polyethylene glycol 3350) .Marland Kitchen... Take 1 scoop (17 g) a day for constipation. (spanish instructions)  Orders: [facility]- Est  Level 4 ([address])  Complete Medication List: 1)  Citalopram Hydrobromide 10 Mg Tabs (Citalopram hydrobromide) .... Take 1 tablet for 2 weeks, then increase to 2 tablets every day (spanish instructions 2)  Miralax Powd (Polyethylene glycol 3350) .... Take 1 scoop (17 g) a day for constipation. (spanish instructions)  Patient Instructions: 1)  para hacer el pupu mas blando favor de comer fruita entera como dorazno, manzana, cereza, ciruela, and verduras como habichuela, zanahoria y tomar 8 vasos de  agua cada dia.  Prescriptions: CITALOPRAM HYDROBROMIDE 10 MG TABS (CITALOPRAM HYDROBROMIDE) take 1 tablet for 2 weeks, then increase to 2 tablets every day (spanish instructions  #60 x 3   Entered by:   Jamie Brookes MD   Authorized by:   Zachery Dauer MD   Signed by:   Jamie Brookes MD on 10/19/2009   Method used:   Electronically to        [facility] [phone]* (retail)       [address]       [place], [place]  [address]       Ph: [phone]       Fax: [phone]   RxID:   [id number] MIRALAX  POWD (POLYETHYLENE GLYCOL 3350) take 1 scoop (17 g) a day for constipation. (spanish instructions)  #1 x 3   Entered by:   Jamie Brookes MD   Authorized by:   Zachery Dauer MD   Signed by:   Jamie Brookes MD on 10/19/2009   Method used:   Electronically to        [facility] [phone]* (retail)       [address]       [place], [place]  [address]       Ph: [phone]       Fax: [phone]   RxID:   [id number]

## 2010-05-12 NOTE — Letter (Signed)
Summary: Results Follow-up Letter  [facility]  [address]   [place], [place] [address]   Phone: [phone]  Fax: [phone]    02/12/2010  [address] [place], [place]  [address]  Dear Ms. HERNANDEZ,   The following are the results of your recent test(s):  Test     Result     Pap Smear    Normal___x____  Not Normal_____       Comments:  Nigun anormalidad en las pruebas de [place].  Todo normal.   Sincerely,  Zachery Dauer MD [facility]           Appended Document: Results Follow-up Letter mailed

## 2010-05-12 NOTE — Progress Notes (Signed)
  Phone Note Outgoing Call   Call placed by: Paula Compton MD,  Sep 02, 2009 10:11 AM Call placed to: Patient Summary of Call: Call placed in Spanish.  Discussed results of patient's cervical cultures and blood tests for HIV/RPR (all negative).  She was treated for presumed cervicitis after reporting vaginal discharge and pruritus and contact with sexual partner with unspecified STI, for which he was reportedly treated recently.  I reiterated advice to Khai today that she should refrain from sexual contact with partner who may have STI until she is sure that he has been effectively treated.  If her symptoms do not completely resolve, she is instructed to contact our office for recheck. Initial call taken by: Paula Compton MD,  Sep 02, 2009 10:13 AM

## 2010-05-16 ENCOUNTER — Encounter: Payer: Self-pay | Admitting: *Deleted

## 2010-05-28 ENCOUNTER — Ambulatory Visit (INDEPENDENT_AMBULATORY_CARE_PROVIDER_SITE_OTHER): Payer: Self-pay | Admitting: Family Medicine

## 2010-05-28 ENCOUNTER — Other Ambulatory Visit: Payer: Self-pay | Admitting: Family Medicine

## 2010-05-28 ENCOUNTER — Encounter: Payer: Self-pay | Admitting: Family Medicine

## 2010-05-28 VITALS — BP 112/73 | HR 91 | Temp 98.3°F | Ht 61.25 in | Wt 127.4 lb

## 2010-05-28 DIAGNOSIS — N760 Acute vaginitis: Secondary | ICD-10-CM | POA: Insufficient documentation

## 2010-05-28 DIAGNOSIS — R300 Dysuria: Secondary | ICD-10-CM | POA: Insufficient documentation

## 2010-05-28 LAB — POCT URINALYSIS DIPSTICK
Nitrite, UA: NEGATIVE
Protein, UA: NEGATIVE
Urobilinogen, UA: 0.2
pH, UA: 6

## 2010-05-28 LAB — POCT UA - MICROSCOPIC ONLY

## 2010-05-28 LAB — POCT WET PREP (WET MOUNT): Trichomonas Wet Prep HPF POC: NEGATIVE

## 2010-05-28 MED ORDER — CEFTRIAXONE SODIUM 250 MG IJ SOLR: 250.0000 mg | INTRAMUSCULAR | Status: DC

## 2010-05-28 MED ORDER — AZITHROMYCIN 1 G PO PACK: 1.0000 g | PACK | Freq: Once | ORAL | Status: DC

## 2010-05-28 NOTE — Assessment & Plan Note (Signed)
Patient with cervical motion tenderness, thin cervical discharge seen at os.  No fevers, only mild pelvic discomfort with bimanual exam.  Will treat for presumed cervicitis with azithro slurry and ceftriaxone IM.  Urine pregnancy negative; UA with several epithelial cells makes culture less useful.  Wet prep with many WBCs, no other findings that are diagnostic.  Patient to contact me if she feels worse or does not improve.  I will call her at cell phone [phone] with culture results.

## 2010-05-28 NOTE — Patient Instructions (Signed)
Fue un Research officer, trade union.  Recibio' tratamientos para una inflamacion pelvica (ceftriaxone intramuscular y azithromycin via oral).  Le llamo al [phone] con los resultados de los cultivos que sacamos hoy  Le aconsejo que no tenga relaciones sexuales hasta que no se resuelva esta situacion.  Por favor llameme si le persiste la molestia en la semana que viene, si tiene fiebres o si comienza con nauseas o vomito.

## 2010-05-28 NOTE — Progress Notes (Signed)
  Subjective:    Patient ID: Traci Porter, female    DOB: September 12, 1990, 20 y.o.   MRN: [id number]  HPI  Visit conducted in Spanish.  Traci Porter complains of lower pelvic discomfort, originating in midline suprapubic area and radiating to bilat sides.  Some dysuria, which has been ongoing for about 3 weeks.  Reports some white vaginal discharge.  No change in urinary patterns.  Positive dyspareunia.  Mild constipation, no blood in stool.  Feels like "needles' when she voids.  LMP began 2/14, lasted for 3 days.    Review of Systems Denies fevers or chills, no weight loss.  Husband recently treated at his doctor's office for some sort of genital infection.       Objective:   Physical Exam  Constitutional:       Well appearing, no apparent distress.   Cardiovascular: Normal rate and regular rhythm.   Pulmonary/Chest: Effort normal and breath sounds normal.  Abdominal: Soft. Bowel sounds are normal.  Genitourinary:       Mild suprapubic tenderness.  No CVA tenderness.   Normal vaginal mucosa, no lesions.  Cervix with white discharge seen at os.  No pooling of secretions.  Cervical motion tenderness and mild bilat adnexal tenderness with bimanual exam.           Assessment & Plan:

## 2010-05-29 LAB — GC/CHLAMYDIA PROBE AMP, GENITAL
Chlamydia, DNA Probe: NEGATIVE
GC Probe Amp, Genital: NEGATIVE

## 2010-06-02 ENCOUNTER — Telehealth: Payer: Self-pay | Admitting: Family Medicine

## 2010-06-02 NOTE — Telephone Encounter (Signed)
Called patient, discussed in Spanish.  She reports that her symptoms are resolved.  I informed her of negative cervical cultures, need to come back/call if recurs. She voices understanding.

## 2010-06-27 LAB — DIFFERENTIAL
Basophils Absolute: 0 10*3/uL (ref 0.0–0.1)
Basophils Relative: 0 % (ref 0–1)
Eosinophils Absolute: 0.3 10*3/uL (ref 0.0–0.7)
Monocytes Absolute: 1.1 10*3/uL — ABNORMAL HIGH (ref 0.1–1.0)
Neutro Abs: 11.4 10*3/uL — ABNORMAL HIGH (ref 1.7–7.7)
Neutrophils Relative %: 82 % — ABNORMAL HIGH (ref 43–77)

## 2010-06-27 LAB — URINALYSIS, ROUTINE W REFLEX MICROSCOPIC
Bilirubin Urine: NEGATIVE
Glucose, UA: NEGATIVE mg/dL
Hgb urine dipstick: NEGATIVE
Ketones, ur: NEGATIVE mg/dL
Protein, ur: NEGATIVE mg/dL
Urobilinogen, UA: 0.2 mg/dL (ref 0.0–1.0)

## 2010-06-27 LAB — GRAM STAIN

## 2010-06-27 LAB — POCT I-STAT, CHEM 8
BUN: 9 mg/dL (ref 6–23)
Creatinine, Ser: 0.7 mg/dL (ref 0.4–1.2)
Glucose, Bld: 102 mg/dL — ABNORMAL HIGH (ref 70–99)
Hemoglobin: 15 g/dL (ref 12.0–15.0)
Potassium: 3.7 mEq/L (ref 3.5–5.1)

## 2010-06-27 LAB — CBC
HCT: 41.9 % (ref 36.0–46.0)
Platelets: 214 10*3/uL (ref 150–400)
WBC: 13.9 10*3/uL — ABNORMAL HIGH (ref 4.0–10.5)

## 2010-06-27 LAB — RAPID STREP SCREEN (MED CTR MEBANE ONLY): Streptococcus, Group A Screen (Direct): NEGATIVE

## 2010-06-27 LAB — CSF CELL COUNT WITH DIFFERENTIAL
RBC Count, CSF: 1 /mm3 — ABNORMAL HIGH
WBC, CSF: 1 /mm3 (ref 0–5)

## 2010-06-27 LAB — CULTURE, BLOOD (ROUTINE X 2): Culture: NO GROWTH

## 2010-06-27 LAB — CSF CULTURE W GRAM STAIN: Culture: NO GROWTH

## 2010-06-27 LAB — URINE CULTURE: Culture: NO GROWTH

## 2010-08-17 ENCOUNTER — Ambulatory Visit (INDEPENDENT_AMBULATORY_CARE_PROVIDER_SITE_OTHER): Payer: Self-pay | Admitting: Family Medicine

## 2010-08-17 ENCOUNTER — Encounter: Payer: Self-pay | Admitting: Family Medicine

## 2010-08-17 VITALS — BP 109/70 | HR 71 | Temp 98.5°F | Ht 60.75 in | Wt 128.0 lb

## 2010-08-17 DIAGNOSIS — N760 Acute vaginitis: Secondary | ICD-10-CM

## 2010-08-17 DIAGNOSIS — N979 Female infertility, unspecified: Secondary | ICD-10-CM | POA: Insufficient documentation

## 2010-08-17 DIAGNOSIS — N644 Mastodynia: Secondary | ICD-10-CM | POA: Insufficient documentation

## 2010-08-17 DIAGNOSIS — N912 Amenorrhea, unspecified: Secondary | ICD-10-CM

## 2010-08-17 LAB — POCT WET PREP (WET MOUNT): Trichomonas Wet Prep HPF POC: NEGATIVE

## 2010-08-17 LAB — POCT URINE PREGNANCY: Preg Test, Ur: NEGATIVE

## 2010-08-17 MED ORDER — METRONIDAZOLE 500 MG PO TABS: 500.0000 mg | ORAL_TABLET | Freq: Two times a day (BID) | ORAL | Status: AC

## 2010-08-17 NOTE — Progress Notes (Signed)
  Subjective:    Patient ID: Traci Porter, female    DOB: 01-03-1991, 20 y.o.   MRN: [id number]  HPI and doors. Periods have been regular for the last few months with the last one being in early April. She's had breast tenderness bilaterally without mass the last 3 weeks. She did a home pregnancy test weaken a half ago which was negative. The last 3 days she's had vaginal discharge with a fishy odor and been uncomfortable with sexual relations, and has had lower pelvic pain. She believes her areola are enlarging and darker.  She did receive an injection and something to drink when last seen by Dr. Mauricio Porter. The culture for [facility] and chlamydia was negative. She denies headache, visual changes, or burning on urination. Is not using anything to prevent pregnancy. Both she and her partner, Traci Porter would like a child together. He's never had one.     Review of Systems     Objective:   Physical Exam Well-appearing. Breasts have no masses or focal tenderness. Her areola are normal. No axillary adenopathy. Heart regular regular rhythm without murmur. Thyroid no palpable enlarged. Abdomen is soft without masses or tenderness. Pelvic thin white discharge, involving the cervix. No bleeding. Mild discomfort with cervical motion. No adnexal enlargement, but tender bilaterally. Urine pregnancy test negative.       Assessment & Plan:

## 2010-08-17 NOTE — Assessment & Plan Note (Signed)
A few clue cells so will treat with Metronidazole. Due to pelvic pain, GC/chlamydia swab sent.

## 2010-08-17 NOTE — Assessment & Plan Note (Signed)
Hormonal. This and areolar changes she perceives may reflect her strong desire for a pregnancy.

## 2010-08-17 NOTE — Patient Instructions (Addendum)
Please call [name] tomorrow to arrange a visit for her partner, [name], in [facility]. Her cellular number is [phone]. The other number listed is not active.  Voy a llamarle con los resultados del cultivo vaginal   Ud padece de vagitis non-specifico. Tome las pastillas Metronidazole para [name]

## 2010-08-18 ENCOUNTER — Encounter: Payer: Self-pay | Admitting: Family Medicine

## 2010-08-27 NOTE — Op Note (Signed)
NAMEREHMAT, MURTAGH              ACCOUNT NO.:  [id number]   MEDICAL RECORD NO.:  [id number]          PATIENT TYPE:  INP   LOCATION:  9103                          FACILITY:  [facility]   PHYSICIAN:  Phil D. Okey Dupre, M.D.     DATE OF BIRTH:  08/27/1990   DATE OF PROCEDURE:  07/24/2006  DATE OF DISCHARGE:                               OPERATIVE REPORT   PROCEDURE:  Evacuation drainage of left labial hematoma and repair of  defect made during that procedure  with vaginal packing.   SURGEON:  Dr. Okey Dupre.   ANESTHESIA:  Spinal.   ESTIMATED BLOOD LOSS:  200 mL.   POSTOPERATIVE CONDITION:  Satisfactory.   SPECIMENS TO PATHOLOGY:  None   OPERATIVE FINDINGS:  We saw the patient approximately 1-1/2 hours to 1  hour and 45 minutes after her delivery by vacuum, and she had a quickly  enlarging hematoma of the left labia.  By examination by delivering  doctor afterwards, there were apparently no internal vaginal  lacerations.  The patient was taken immediately to the operating room  for the procedure.  At the time she arrived in the operating room, the  hematoma measured about 12 cm in height and 5 cm in diameter.   PROCEDURE WENT AS FOLLOWS:  Under satisfactory spinal anesthesia with  patient in the dorsal lithotomy position, the perineum and vagina were  prepped and draped in the usual sterile manner.  Palpation and  examination of this abscess revealed some abrasions just inside the  labia and outside of the hymenal ring at the upper edge of the labia.  The vagina and cervix were examined.  There were no lacerations as  previously been reported.  In the area of the abrasion, a 2-cm vertical  incision was made, and the blood within the abscess cavity was evacuated  measuring about 200 mL.  Once this was done, the rent that we had made  was repaired leaving only a small opening, and this was done with 3  figure-of-eight sutures, 2 of 0 chromic and 1 in the middle of 3-0  chromic.  The area was  injected with 0.50% Marcaine for further patient  comfort, and a vaginal pack was placed in and then run out to the labia  as far as we could for pressure compression of any bleeders that may be  going to the formation of this hematoma, and an ice pack was placed  against this with a glove full of ice, and the Foley catheter was left  in place, and the ice bag was held in place with postoperative panties.  The patient was then returned to recovery room in satisfactory  condition, having tolerated the procedure well.           ______________________________  Javier Glazier. Okey Dupre, M.D.     PDR/MEDQ  D:  07/24/2006  T:  07/25/2006  Job:  [id number]

## 2010-09-27 ENCOUNTER — Ambulatory Visit: Payer: Self-pay | Admitting: Family Medicine

## 2010-10-04 ENCOUNTER — Ambulatory Visit (INDEPENDENT_AMBULATORY_CARE_PROVIDER_SITE_OTHER): Payer: Self-pay | Admitting: Family Medicine

## 2010-10-04 VITALS — BP 108/69 | HR 72 | Temp 98.7°F | Wt 135.9 lb

## 2010-10-04 DIAGNOSIS — H1013 Acute atopic conjunctivitis, bilateral: Secondary | ICD-10-CM

## 2010-10-04 DIAGNOSIS — H1045 Other chronic allergic conjunctivitis: Secondary | ICD-10-CM

## 2010-10-04 MED ORDER — OLOPATADINE HCL 0.2 % OP SOLN: 1.0000 [drp] | Freq: Two times a day (BID) | OPHTHALMIC | Status: DC

## 2010-10-04 NOTE — Progress Notes (Signed)
  Subjective:    Traci Porter is a 20 y.o. female who presents for evaluation of erythema, itching and tearing in both eyes. She has noticed the above symptoms for several days. Onset was gradual. Patient denies blurred vision, pain and visual field deficit. There is a history of allergies.  The following portions of the patient's history were reviewed and updated as appropriate: allergies, current medications, past family history, past medical history, past social history, past surgical history and problem list.  Review of Systems Pertinent items are noted in HPI.   Objective:    BP 108/69  Pulse 72  Temp(Src) 98.7 F (37.1 C) (Oral)  Wt 135 lb 14.4 oz (61.644 kg)      General: alert, cooperative and no distress  Eyes:  positive findings: conjunctiva: bilateral allergic conjunctivitis and sclera slightly injected  Vision: 20/20  Fluorescein:  not done     Assessment:    Acute conjunctivitis   Plan:    Ophthalmic ointment per orders.

## 2010-10-04 NOTE — Patient Instructions (Signed)
Conjuntivitis alrgica (Allergic Conjunctivitis) La conjuntiva es una delgada membrana mucosa (secrecin) que cubre la parte visible del globo ocular y la parte interior de los prpados. Esta membrana protege y [place] ojo. La membrana tiene pequeos vasos sanguneos que pueden verse normalmente. Cuando la conjuntiva se inflama, produce un trastorno denominado conjuntivitis. En respuesta a la inflamacin, los vasos sanguneos de la conjuntiva se hinchan. La hinchazn da como resultado enrojecimiento de la zona del ojo que normalmente es blanca. Cuando una persona es alrgica esta membrana reacciona, y por lo tanto a este trastorno se lo denomina conjuntivitis Counselling psychologist. El problema generalmente dura mientras persiste la alergia. La conjuntivitis alrgica no se transmite a Economist (no es contagiosa). La probabilidad de infeccin bacteriana es grande y [place] no se deba a una alergia si en el ojo inflamado se observa:  Una secrecin pegajosa.   Secrecin o pegoteo de las pestaas por la [place].   Escamas en los prpados, en la zona en que se implantan las pestaas.   Hinchazn y enrojecimiento de los prpados   CAUSAS  Grmenes (bacterias).   Virus   Irritantes, como cuerpos extraos.   Lesin por Economist.   Sustancias qumicas   Reacciones alrgicas   Inflamacin o enfermedades graves de la zona interior o exterior del ojo o de la rbita (la cavidad sea en la que el ojo se inserta) pueden causar un "ojo rojo".  SNTOMAS  Enrojecimiento ocular.   Lagrimeo.   Picazn   Sensacin de ardor   [employer].   Reaccin alrgica debido al polen o sensibilidad a la ambrosa. La conjuntivitis alrgica estacional es frecuente en primavera, cuando el polen se encuentra en el aire y tambin en otoo.  DIAGNSTICO Este trastorno, en sus variadas formas, se diagnostica segn la historia clnica y el examen oftalmolgico. Generalmente implica ambos ojos Si sus  ojos reaccionan al [employer], la causa podra ser [place]aring. La mayora de los casos de enrojecimiento ocular se deben a Runner, broadcasting/film/video o una infeccin, pero es muy importante el diagnstico oftalmolgico. El examen puede descartar enfermedades graves del ojo o de la rbita. TRATAMIENTO  Podrn prescribirle gotas oftlmicas sin antibitico, ungentos o medicamentos por va oral, si el oftalmlogo est seguro que la conjuntivitis slo se debe a una alergia.   Las gotas y ungentos de venta libre para los sntomas alrgicos deben usarse slo despus que se hayan descartado otras causas de conjuntivitis, o segn las indicaciones del mdico.  Los medicamentos por boca generalmente se utilizan si tambin existen otros Artist. Si el oftalmlogo est seguro que el medicamento se debe slo a una Reader, el tratamiento se limita a gotas o ungentos para reducir Haematologist. INSTRUCCIONES PARA EL CUIDADO DOMICILIARIO  Lvese las manos antes y despus de Contractor gotas o ungentos, o de tocarse el ojo inflamado o los prpados.   Suspenda el uso de las lentes de contacto blandas y descrtelas. Use un nuevo par de lentes cuando se haya recuperado completamente. Si va a utilizar nuevamente las mismas lentes de contacto, complete los ciclos de esterilizacin al menos tres veces. Debe suspender el uso de las lentes de contacto duras. Deben ser cuidadosamente esterilizadas antes del uso, luego de la recuperacin.   No deje que la punta del gotero o del tubo del ungento toque el prpado al Scientist, product/process development medicamento en el ojo.   La picazn y el ardor debido a Environmental consultant se Chief Executive Officer  colocando un pao fro [employer].  SOLICITE ATENCIN MDICA SI:   Los problemas no desaparecen luego de dos o [place].   Tiene secreciones. Podr ser necesaria la administracin de antibiticos en forma de gotas, ungentos o por va oral.   Tiene extrema  sensibilidad a la luz.   Presenta una temperatura oral superior a 100.4.   Siente dolor alrededor del ojo o desarrolla algn otro sntoma visual.  EST SEGURO QUE:   Comprende las instrucciones para el alta mdica.   Controlar su enfermedad.   Solicitar atencin mdica de inmediato segn las indicaciones.  Document Released: 03/28/2005 Document Re-Released: 09/15/2009 [facility] Patient Information 2011 [place], [place].

## 2010-10-06 NOTE — Assessment & Plan Note (Signed)
She couldn't afford the Pataday so the pharmacy faxed requesting a substitute. I called her and she is having some discharge in the corner of her eyes, her eyes hurt and in her throat is mucus. Not sneezing. This likely is viral URI. I recommended OTC Loratidine 10 mg daily. She will schedule a return visit if she doesn't improve by the beginning of next week. No contacts have conjunctivitis sx.

## 2010-10-06 NOTE — Progress Notes (Signed)
Addended by: Zachery Dauer on: 10/06/2010 06:29 PM   Modules accepted: Orders

## 2011-01-21 ENCOUNTER — Encounter: Payer: Self-pay | Admitting: Family Medicine

## 2011-01-21 ENCOUNTER — Ambulatory Visit (INDEPENDENT_AMBULATORY_CARE_PROVIDER_SITE_OTHER): Payer: Self-pay | Admitting: Family Medicine

## 2011-01-21 VITALS — BP 110/70 | Temp 98.5°F | Wt 139.5 lb

## 2011-01-21 DIAGNOSIS — N940 Mittelschmerz: Secondary | ICD-10-CM | POA: Insufficient documentation

## 2011-01-21 DIAGNOSIS — K5909 Other constipation: Secondary | ICD-10-CM

## 2011-01-21 DIAGNOSIS — N898 Other specified noninflammatory disorders of vagina: Secondary | ICD-10-CM

## 2011-01-21 DIAGNOSIS — N72 Inflammatory disease of cervix uteri: Secondary | ICD-10-CM

## 2011-01-21 DIAGNOSIS — N926 Irregular menstruation, unspecified: Secondary | ICD-10-CM

## 2011-01-21 LAB — POCT WET PREP (WET MOUNT): Clue Cells Wet Prep HPF POC: NEGATIVE

## 2011-01-21 LAB — POCT URINE PREGNANCY: Preg Test, Ur: NEGATIVE

## 2011-01-21 MED ORDER — MAGNESIUM CITRATE PO SOLN: 148.0000 mL | Freq: Once | ORAL | Status: DC

## 2011-01-21 NOTE — Progress Notes (Signed)
Dorr presents to clinic today complaining of 2-3 weeks of lower abdominal/pelvic pain. She notes that the pain is intermittent and not really associated with any specific activity. She denies any vomiting or diarrhea. She does not continued constipation. She'll have the painful stool every 3 days or so when her last bowel movement was several days ago. She does note vaginal discharge. Her last menstrual period was in May. She has irregular cycles and is not using contraceptive. No fevers or chills, she is also eating well.  PMH reviewed. Significant for chronic constipation and irregular menstrual cycles ROS as above otherwise neg Medications reviewed.  Exam:  BP 110/70  Temp(Src) 98.5 F (36.9 C) (Oral)  Wt 139 lb 8 oz (63.277 kg)  LMP 08/21/2010 Gen: Well NAD HEENT: EOMI,  MMM Lungs: CTABL Nl WOB Heart: RRR no MRG Abd: NABS, NT, ND GYN: Normal external genitalia. White discharge present. Normal vaginal canal and cervix. No adnexal mass or tenderness uterus normal size and position.

## 2011-01-21 NOTE — Patient Instructions (Signed)
Thank you for coming in today. I think your pain is from constipation.  Drink 1/2 to 1/3 of a bottle of the mag citrate daily for 1 week.  You should have a poop every day.  Continue your normal stool softeners.  See Dr. Sheffield Slider in 2 months to follow up constipation and your irregular bleeding and infertility.   Constipacin en los adultos (Constipation in Adults) Se denomina constipacin al hecho de mover el intestino menos de dos veces por semana. Generalmente las heces son duras. A medida que envejecemos, la constipacin es ms frecuente. Si trata de solucionarlo con laxantes, puede empeorar el problema. Los laxantes utilizados durante largos perodos pueden [employer] msculos del colon. Esto har que la constipacin empeore gradualmente. Una dieta pobre en fibras, la ingesta insuficiente de lquidos y algunos medicamentos pueden empeorar el problema. ALGUNOS MEDICAMENTOS QUE PUEDEN CAUSAR CONSTIPACIN SON:  Diurticos.   Boqueadotes de los canales de calcio (medicamento utilizado para Chief Operating Officer la presin arterial y el funcionamiento cardaco.     Narcticos (ciertos medicamentos para Chief Technology Officer).     Anticolinrgicos.     Antiinflamatorios.    Anticidos que contengan aluminio.     ALGUNOS MEDICAMENTOS QUE PUEDEN CAUSAR CONSTIPACIN SON:   Diabetes.   Enfermedad de Parkinson.     Demencia (la mente no funciona adecuadamente y existen trastornos del pensamiento).     Ictus.   Depresin.    Otras enfermedades que causan dificultades con el metabolismo de sales y lquidos.     INSTRUCCIONES PARA EL CUIDADO DOMICILIARIO  El mejor tratamiento para la constipacin es el que se realiza sin tomar medicamentos. Es importante consumir ms fibras, frutas y Sports administrator.   Aumente lentamente el consumo de fibras a 25 a 38 gramos por da. Granos integrales, frutas, verduras y legumbres son buenas fuentes de [place]. Un dietista podr ayudarlo a incorporar alimentos altos en fibra en su dieta.    Beba gran cantidad de lquido para mantener la orina de tono claro o color amarillo plido.   Deber aadir un suplemento de fibra en su dieta si no puede recibir la cantidad suficiente a partir de los alimentos.   Aumentar la actividad fsica tambin ayuda a mejorar la regularidad.   Los supositorios, segn lo haya indicado el mdico, podrn ser de utilidad. Si toma anticidos u otros productos que contengan aluminio o calcio, los que pueden causar constipacin, ser de gran ayuda cambiarlos por productos que contengan magnesio, con la aprobacin del mdico.   Si en el da de hoy le han aplicado un enema, considere que esta slo es una medida temporaria y no debe considerarse como tratamiento para la constipacin de Set designer data (crnica). Si utiliza enemas [employer], se debilitarn los msculos del colon y la Quarry manager.   Tambin deben evitarse en lo posible medidas ms enrgicas, como el consumo de sulfato de magnesio, siempre que sea posible. El sulfato de magnesio puede causar diarrea incontrolable. Sin embargo, si usted es una persona de edad [place], estas medidas pueden ser tentadoras. En algunos casos, este tipo de medidas ni siquiera le dan tiempo para llegar al bao.  SOLICITE ATENCIN MDICA DE INMEDIATO SI:  Observa sangre de color rojo brillante en las heces.   El estreimiento persiste durante ms de [employer]   Presenta dolor abdominal o rectal junto con el estreimiento.   No parece sentirse mejor.   Tiene preguntas para formular, o alguna preocupacin, o no mejora.  EST SEGURO QUE:  Comprende las instrucciones para el alta mdica.   Controlar su enfermedad.   Solicitar atencin mdica de inmediato segn las indicaciones.  Document Released: 04/17/2007 Document Re-Released: 09/15/2009 [facility] Patient Information 2011 [place], [place].

## 2011-01-21 NOTE — Assessment & Plan Note (Signed)
Not taking MiraLAX due to cost. Is using Metamucil. Currently exacerbation. Plan to use mag citrate 1/2-1/3 of the bottle daily for a week, then as needed. Plan to follow up with Dr. Sheffield Slider to discuss continuation of chronic constipation and what is likely polycystic ovarian syndrome. This patient may benefit from metformin, if she wishes to conceive.

## 2011-01-21 NOTE — Assessment & Plan Note (Signed)
I think due to constipation. No red flag signs or symptom. Urine pregnancy test is negative and wet prep is normal. GC chlamydia obtained.  Plan to treat constipation as listed below

## 2011-01-22 LAB — GC/CHLAMYDIA PROBE AMP, GENITAL
Chlamydia, DNA Probe: NEGATIVE
GC Probe Amp, Genital: NEGATIVE

## 2011-01-25 ENCOUNTER — Emergency Department (HOSPITAL_COMMUNITY)
Admission: EM | Admit: 2011-01-25 | Discharge: 2011-01-25 | Disposition: A | Discharge status: Home / Self Care | Payer: Self-pay | Attending: Emergency Medicine | Admitting: Emergency Medicine

## 2011-01-25 DIAGNOSIS — N390 Urinary tract infection, site not specified: Secondary | ICD-10-CM | POA: Insufficient documentation

## 2011-01-25 DIAGNOSIS — M545 Low back pain, unspecified: Secondary | ICD-10-CM | POA: Insufficient documentation

## 2011-01-25 LAB — URINALYSIS, ROUTINE W REFLEX MICROSCOPIC
Protein, ur: NEGATIVE mg/dL
Specific Gravity, Urine: 1.024 (ref 1.005–1.030)
Urobilinogen, UA: 1 mg/dL (ref 0.0–1.0)

## 2011-01-25 LAB — URINE MICROSCOPIC-ADD ON

## 2011-02-01 ENCOUNTER — Ambulatory Visit: Payer: Self-pay | Admitting: Family Medicine

## 2011-03-22 ENCOUNTER — Ambulatory Visit: Payer: Self-pay | Admitting: Family Medicine

## 2011-06-13 ENCOUNTER — Ambulatory Visit (INDEPENDENT_AMBULATORY_CARE_PROVIDER_SITE_OTHER): Payer: Self-pay | Admitting: Family Medicine

## 2011-06-13 VITALS — BP 109/72 | HR 77 | Temp 98.2°F | Ht 60.75 in | Wt 139.4 lb

## 2011-06-13 DIAGNOSIS — R51 Headache: Secondary | ICD-10-CM

## 2011-06-13 DIAGNOSIS — N926 Irregular menstruation, unspecified: Secondary | ICD-10-CM

## 2011-06-13 DIAGNOSIS — R519 Headache, unspecified: Secondary | ICD-10-CM | POA: Insufficient documentation

## 2011-06-13 NOTE — Patient Instructions (Signed)
Saca una cita can el laboratorio esta semana para las preubas de [place].   Regrese en 2 semanas para revisar los [place] de los laboratorios con Dr. Sheffield Slider.

## 2011-06-13 NOTE — Assessment & Plan Note (Addendum)
Pelvic ultrasound showed no abnormality. But could not rule out PCO S. We'll go forward with laboratory testing for PCO S. Will obtain serum prolactin, FSH, and TSH. Patient does not need endometrial biopsy secondary to age of 21 years old.--Very low risk for uterine cancer.Patient to return in 2 weeks for laboratory review with Dr. Sheffield Slider.

## 2011-06-13 NOTE — Progress Notes (Signed)
  Subjective:    Patient ID: Traci Porter, female    DOB: 1991-03-21, 21 y.o.   MRN: [id number]  HPI   irregular menses: Patient reports vaginal bleeding/spotting on most days. Occasional. Approximately every 6 months. Heavy flow x3 days. Dark in color. No vaginal discharge. No foul odor. No abdominal pain. No history of STDs. Not currently on any contraception. Patient states she would like to be pregnant. Has been diagnosed recently with female infertility. Patient seen by PCP Dr. Kalman Shan ultrasound did not show any abnormality. Could not rule out PCO S. No family history of uterine or ovarian cancer.  Headache: Daily headaches x2-3 weeks. Has taken Tylenol or Motrin on a daily basis. Seems to have headaches usually in the afternoon. Has not had any light sensitivity.. No noise sensitivity. No nausea. No vomiting. Has not missed any school work. Patient currently getting GED. no dizziness. No syncope. No fever. No vision changes.  Review of Systems As per above.    Objective:   Physical Exam  Constitutional: She appears well-developed and well-nourished.  HENT:  Head: Normocephalic and atraumatic.  Neck: No thyromegaly present.  Cardiovascular: Normal rate and regular rhythm.  Exam reveals no friction rub.   No murmur heard. Pulmonary/Chest: Effort normal and breath sounds normal. No respiratory distress. She has no wheezes. She has no rales.  Abdominal: Soft. She exhibits no distension and no mass. There is no tenderness. There is no rebound and no guarding.  Musculoskeletal: She exhibits no edema.  Neurological: She is alert.  Skin: No rash noted.  Psychiatric: She has a normal mood and affect.          Assessment & Plan:

## 2011-06-13 NOTE — Assessment & Plan Note (Signed)
Most likely due to medication overuse headache.  Pt instructed to only limit medication for headaches that are more severe than 8/10.  Pt to keep headache log book and bring to f/up appt with doctor hale.

## 2011-06-13 NOTE — Progress Notes (Signed)
Interpreter Markeita Alicia Namihira for [facility] 

## 2011-06-15 ENCOUNTER — Other Ambulatory Visit: Payer: Self-pay

## 2011-06-15 DIAGNOSIS — N926 Irregular menstruation, unspecified: Secondary | ICD-10-CM

## 2011-06-15 LAB — CBC WITH DIFFERENTIAL/PLATELET
Basophils Relative: 0 % (ref 0–1)
Eosinophils Absolute: 0.8 10*3/uL — ABNORMAL HIGH (ref 0.0–0.7)
Eosinophils Relative: 10 % — ABNORMAL HIGH (ref 0–5)
Lymphs Abs: 1.9 10*3/uL (ref 0.7–4.0)
MCH: 29.5 pg (ref 26.0–34.0)
MCHC: 32.4 g/dL (ref 30.0–36.0)
MCV: 90.9 fL (ref 78.0–100.0)
Neutrophils Relative %: 58 % (ref 43–77)
Platelets: 244 10*3/uL (ref 150–400)
RBC: 4.85 MIL/uL (ref 3.87–5.11)
RDW: 13.4 % (ref 11.5–15.5)

## 2011-06-15 NOTE — Progress Notes (Signed)
Fsh,tsh,prolactin and cbc with diff done today Farah Benish

## 2011-06-27 ENCOUNTER — Ambulatory Visit (INDEPENDENT_AMBULATORY_CARE_PROVIDER_SITE_OTHER): Payer: Self-pay | Admitting: Family Medicine

## 2011-06-27 ENCOUNTER — Encounter: Payer: Self-pay | Admitting: Family Medicine

## 2011-06-27 VITALS — BP 108/66 | HR 75 | Ht 60.75 in | Wt 141.0 lb

## 2011-06-27 DIAGNOSIS — R51 Headache: Secondary | ICD-10-CM

## 2011-06-27 DIAGNOSIS — N979 Female infertility, unspecified: Secondary | ICD-10-CM

## 2011-06-27 DIAGNOSIS — E663 Overweight: Secondary | ICD-10-CM

## 2011-06-27 DIAGNOSIS — K5909 Other constipation: Secondary | ICD-10-CM

## 2011-06-27 LAB — POCT GLYCOSYLATED HEMOGLOBIN (HGB A1C): Hemoglobin A1C: 5.2

## 2011-06-27 MED ORDER — AMITRIPTYLINE HCL 25 MG PO TABS: 25.0000 mg | ORAL_TABLET | Freq: Every day | ORAL | Status: DC

## 2011-06-27 NOTE — Assessment & Plan Note (Signed)
Suspect tension HA versus TMJ as part of her headache complaint.  Will treat with low-dose SSRI, which will have added benefit of helping her to sleep.  Discussed possible adverse effects and will re-evaluate in the coming month.

## 2011-06-27 NOTE — Assessment & Plan Note (Signed)
Amenorrhea, likely anovulatory.  Will check androgens, as well as A1C (patient with overweight but no personal/family history to raise concern for DM).  Would consider metformin if A1C is higher than 5.8%.  Results of A1C reviewed and discussed with patient before she left.

## 2011-06-27 NOTE — Progress Notes (Signed)
  Subjective:    Patient ID: Traci Porter, female    DOB: 10-27-1990, 21 y.o.   MRN: [id number]  HPI Visit conducted in Spanish.   Traci Porter is here to follow up on daily HAs, as well as infertility/amenorrhea.  She reports that she continues to have the daily headaches, which are usually present when she wakes up at 7am to take her son to school.  Also sometimes in afternoons.  Took an otc med Patent examiner) one time since then, helped with the pain (did not bring the med today, I do not know what it contains).  Has bitemporal pain, also frontal pain.  No nausea/vomiting, but sunlight makes it worse.  No history migraine.  No caffeine intake other than a single cup of coffee in the morning.  Drinks no sodas or tea.    Regarding menses, reports last menses was November 2012.  Lasted its usual 3 days.  Penultimate menses was in March 2012.  Denies hirsutism or female pattern hair growth.  No acne on face.  Reports her current partner has never fathered a child.  She is G1P0 and had no complications associated with pregnancy. Has cousins with DM, but parents and siblings do not have.  She has never had abnormal glucose testing.  Current partner went for sperm analysis and told "didn't move well".    Discussed results of labs from last visit.    Review of Systems See hpi.  Grinds her teeth.  Has trouble sleeping (goes to bed at 10pm, but might not fall asleep until 1am' awakens again at 2-3am and stays awake until son wakes up at 7am).  Not sure if she grinds her teeth.  No active dental problems.     Objective:   Physical Exam  Well appearing, no apparent distress HEENT Neck supple. No cervical adenopathy. PERRL EOMI.  Pain at TMJ with open/closing jaw.  No visible dental caries in molars by my brief inspection. Thyroid supple, non-nodular.       Assessment & Plan:

## 2011-06-27 NOTE — Assessment & Plan Note (Signed)
Plans to take psyllium fiber for this.

## 2011-06-27 NOTE — Assessment & Plan Note (Signed)
nonfasting labs done today; including lipid panel and A1C/metabolic panel.

## 2011-06-27 NOTE — Patient Instructions (Signed)
Fue un Research officer, trade union.    Para los dolores de [place], le estoy recetando una nueva medicina que se llama Amitriptyline (Elavil) 25mg , una pastilla por boca antes de acostarse (una hora antes).     Le estoy chequeando algunos laboratorios hoy, para el colesterol y el control del Chief Financial Officer.  Le llamo al [phone] o le mando una carta con [place].   Mantenga una lista/un calendario de los dias cuando le dan los dolores de [place].   Traiga el frasco de [place] para que yo vea lo que contiene, cuando vuelva para su proxima cita en un mes.   FOLLOW UP APPOINTMENT WITH DR Mauricio Po IN 3 TO 5 WEEKS.

## 2011-06-28 LAB — LIPID PANEL
Cholesterol: 172 mg/dL (ref 0–200)
Total CHOL/HDL Ratio: 4.3 Ratio
VLDL: 61 mg/dL — ABNORMAL HIGH (ref 0–40)

## 2011-06-28 LAB — TESTOSTERONE, FREE, TOTAL, SHBG
Sex Hormone Binding: 32 nmol/L (ref 18–114)
Testosterone-% Free: 1.8 % (ref 0.4–2.4)
Testosterone: 55.18 ng/dL (ref 10–70)

## 2011-07-04 ENCOUNTER — Encounter: Payer: Self-pay | Admitting: Family Medicine

## 2011-07-29 ENCOUNTER — Encounter: Payer: Self-pay | Admitting: Family Medicine

## 2011-07-29 ENCOUNTER — Ambulatory Visit (INDEPENDENT_AMBULATORY_CARE_PROVIDER_SITE_OTHER): Payer: Self-pay | Admitting: Family Medicine

## 2011-07-29 VITALS — BP 113/70 | HR 76 | Ht 60.75 in | Wt 136.0 lb

## 2011-07-29 DIAGNOSIS — R51 Headache: Secondary | ICD-10-CM

## 2011-07-29 MED ORDER — AMITRIPTYLINE HCL 25 MG PO TABS: 25.0000 mg | ORAL_TABLET | Freq: Every day | ORAL | Status: DC

## 2011-07-29 NOTE — Progress Notes (Signed)
Subjective:     Patient ID: Traci Porter, female   DOB: 07/17/1990, 21 y.o.   MRN: [id number]  HPI Visit conducted in Spanish.  Traci Porter is a 21 yo here for f/u on her headaches and lack of sleep. She was prescribed amitriptyline 25mg  last month for persistent morning headaches. She was falling asleep very late (around 1-2am) and waking up at 7am to care for her child. She is very pleased with the medication's effects. Her HAs are no longer daily. She still gets HAs in the early morning or afternoon, however they are very light and disappear quickly. As for sleep, she falls asleep around 10AM and wakes up at 7AM. She does report some morning grogginess. Takes med at 9pm. Otherwise, no SE from the medications.  She take BiElectro (acetaminophen, aspirin, caffeine combo) for 'very bad' headaches. Infrequent use of these.  No n/v/d, vision changes, abd pain, change in bowel habits. No complaints.  Review of Systems     Objective:   Physical Exam Gen: well-appearing, nad HEENT: ncat, mmm, no pressure upon palpation of region of HA distribution. CV: RRR, no m/r/g Lungs: CTAB, normal WOB Ext: warm, well-perfused    Assessment:         Plan:     Traci Porter is following up for her HAs and lack of sleep. She is very well controlled on amitriptyline and does not wish any changes to her medication. She has some grogginess in the morning, otherwise no SE. Recommended that she takes the medication at 8AM to, hopefully, prevent the prolongation of its sleep effects.  We also recommended that she takes a multivitamin, as she is of childbearing age. We advised that any OTC generic should suffice.     Attending Note   Patient seen and examined with Elinor Parkinson [facility] MS[facility]), I have reviewed his note and agree with his documentation.  Visit conducted inSpanish.  [name] reports marked decrease in her headache frequency and intensity.  She takes the elavil in the evening, at 9pm, goes to bed at 10pm.  She sleeps  until 7am, when she gets her son ready for school.  Sometimes can go back to sleep for longer.  No nausea or photophobia.   She brings in a vial of meds that she takes when headache is severe; called Bi Electro, and contains acetaminophen (250mg ), ASA (250mg ), and caffeine (65mg ).  Takes 2 tablets when HA severe (rare since starting elavil).   Exam Well appearing, no apparent distress  HEENT TMs clear bilat. No frontal or maxillary sinus tenderness.  EOMI PERRL. Thyroid supple, non-nodular.  COR S1S2, no extra sounds PULM Clear bilaterally.  Paula Compton, MD

## 2011-07-29 NOTE — Assessment & Plan Note (Signed)
Marked improvement since starting elavil.  No side effects noted (no dry mouth, denies constipation).  May continue at current dosing.  To call back if changes.

## 2011-07-29 NOTE — Patient Instructions (Signed)
Fue un Research officer, trade union. Me alegro que los dolores de cabeza estan mejor.   Puede tomar la amitriptylina 25mg  una hora antes de cuando la toma ahora, para que no le deje con resaca por la [place].  Le puse surtidos hasta octubre 2013.  Por favor llame si parece que esta' empeorando o con cualquier pregunta o problema.

## 2011-08-05 ENCOUNTER — Encounter: Payer: Self-pay | Admitting: Family Medicine

## 2011-08-05 ENCOUNTER — Ambulatory Visit (INDEPENDENT_AMBULATORY_CARE_PROVIDER_SITE_OTHER): Payer: Self-pay | Admitting: Family Medicine

## 2011-08-05 VITALS — BP 99/65 | HR 66 | Temp 98.1°F | Ht 60.75 in | Wt 136.0 lb

## 2011-08-05 DIAGNOSIS — R300 Dysuria: Secondary | ICD-10-CM

## 2011-08-05 DIAGNOSIS — R3915 Urgency of urination: Secondary | ICD-10-CM | POA: Insufficient documentation

## 2011-08-05 DIAGNOSIS — N926 Irregular menstruation, unspecified: Secondary | ICD-10-CM

## 2011-08-05 LAB — POCT URINALYSIS DIPSTICK
Ketones, UA: NEGATIVE
Protein, UA: 100
Spec Grav, UA: 1.025
Urobilinogen, UA: 1
pH, UA: 6

## 2011-08-05 LAB — POCT UA - MICROSCOPIC ONLY

## 2011-08-05 MED ORDER — CEPHALEXIN 500 MG PO TABS: 500.0000 mg | ORAL_TABLET | Freq: Three times a day (TID) | ORAL | Status: AC

## 2011-08-05 NOTE — Patient Instructions (Signed)
Fue un Research officer, trade union.  Estoy dandole una receta para un antibiotico que se llama cephalexin 500mg , tome una tableta tres veces por dia, por 5 dias.   Siga tomando [employer].   Le llamo el lunes [phone]) para darle el resultado del conteo de [place], [place] del cultivo de [place].

## 2011-08-05 NOTE — Assessment & Plan Note (Signed)
Patient with urinary changes including dysuria and polyuria; also with generalized aches but no fevers or chills, no CVA tenderness on exam.  No vaginal discharge preceding recent onset of vaginal bleeding (presumed to be menses)>  Also in differential is early SAB, given malaise, breast tenderness and vomiting yesterday.  Pregnancy test negative in office today.  UA obscured by heavy bleeding; will treat empirically for cystitis with Keflex and give instructions about when to come back/seek more emergent care.  WIll check a CBC today in light of reported heavy bleeding, mild conjunctival pallor.

## 2011-08-05 NOTE — Telephone Encounter (Signed)
error 

## 2011-08-05 NOTE — Progress Notes (Signed)
  Subjective:    Patient ID: Traci Porter, female    DOB: 1990/10/28, 21 y.o.   MRN: [id number]  HPI Visit conducted in Spanish.  Traci Porter presents with complaint of feeling generalized malaise, including generalized soreness, breast tenderness, and lower pelvic discomfort, accompanied by vaginal bleeding that began yesterday and has gone through 12 pads since yesterday. Has had dysuria and malorodorous urine that started on teh day prior to onset of vaginal bleeding, and continues with this and polyuria today. Has not had fevers; did have an episode of emesis yesterday and has had decreased appetite for 2 days.  No diarrhea, no sick contacts.  Denies vaginal discharge.   Previous menses was in November, and before that in March.  She is not using any form of contraceptives at this point.  She checked a home pregnancy test about one week ago and was negative.   Took an analgesic that her mother brought [place] from [place] helped her aches yesterday. Didn't take it today.  Review of SystemsSee Above.      Objective:   Physical Exam Generally well appearing, no apparent distress.  HEENT Neck supple, moist mucus membranes. Clear oropharynx. No cervical adenopathy. Mild conjunctival pallor COR S1S2, no extra sounds PULM Clear bilaterally, no rales or wheezes ABD Soft, nontender; does have some mild suprapubic tenderness, no CVA tenderness, no masses/megaly.  No focal tenderness in RUQ/RLQ.        Assessment & Plan:

## 2011-08-06 LAB — CBC
HCT: 43 % (ref 36.0–46.0)
MCHC: 31.9 g/dL (ref 30.0–36.0)
MCV: 91.5 fL (ref 78.0–100.0)
Platelets: 253 10*3/uL (ref 150–400)
RDW: 14 % (ref 11.5–15.5)

## 2011-08-08 NOTE — Telephone Encounter (Signed)
This encounter was created in error - please disregard.

## 2011-08-10 ENCOUNTER — Telehealth: Payer: Self-pay | Admitting: Family Medicine

## 2011-08-10 NOTE — Telephone Encounter (Signed)
Called patient, conversation in [place].  Called to discuss results of CBC and urine culture.  She is feeling better; had vaginal bleeding that has resolved; no dysuria now after antibiotics.  She is to call back as needed.  Paula Compton, MD

## 2011-09-12 ENCOUNTER — Ambulatory Visit (HOSPITAL_COMMUNITY)
Admission: RE | Admit: 2011-09-12 | Discharge: 2011-09-12 | Disposition: A | Discharge status: Home / Self Care | Payer: Self-pay | Source: Ambulatory Visit | Attending: Family Medicine | Admitting: Family Medicine

## 2011-09-12 ENCOUNTER — Encounter: Payer: Self-pay | Admitting: Family Medicine

## 2011-09-12 ENCOUNTER — Ambulatory Visit (INDEPENDENT_AMBULATORY_CARE_PROVIDER_SITE_OTHER): Payer: Self-pay | Admitting: Family Medicine

## 2011-09-12 ENCOUNTER — Other Ambulatory Visit (HOSPITAL_COMMUNITY)
Admission: RE | Admit: 2011-09-12 | Discharge: 2011-09-12 | Disposition: A | Discharge status: Home / Self Care | Payer: Self-pay | Source: Ambulatory Visit | Attending: Family Medicine | Admitting: Family Medicine

## 2011-09-12 VITALS — BP 115/77 | HR 76 | Ht 60.75 in | Wt 136.4 lb

## 2011-09-12 DIAGNOSIS — R3915 Urgency of urination: Secondary | ICD-10-CM

## 2011-09-12 DIAGNOSIS — G8929 Other chronic pain: Secondary | ICD-10-CM | POA: Insufficient documentation

## 2011-09-12 DIAGNOSIS — Z113 Encounter for screening for infections with a predominantly sexual mode of transmission: Secondary | ICD-10-CM | POA: Insufficient documentation

## 2011-09-12 DIAGNOSIS — N949 Unspecified condition associated with female genital organs and menstrual cycle: Secondary | ICD-10-CM

## 2011-09-12 DIAGNOSIS — R102 Pelvic and perineal pain: Secondary | ICD-10-CM

## 2011-09-12 DIAGNOSIS — R079 Chest pain, unspecified: Secondary | ICD-10-CM | POA: Insufficient documentation

## 2011-09-12 DIAGNOSIS — N979 Female infertility, unspecified: Secondary | ICD-10-CM

## 2011-09-12 LAB — POCT URINALYSIS DIPSTICK
Blood, UA: NEGATIVE
Ketones, UA: NEGATIVE
Protein, UA: NEGATIVE
Spec Grav, UA: 1.015
pH, UA: 8

## 2011-09-12 LAB — POCT WET PREP (WET MOUNT): Clue Cells Wet Prep Whiff POC: NEGATIVE

## 2011-09-12 MED ORDER — ALUMINUM CHLORIDE 20 % EX SOLN: Freq: Every day | CUTANEOUS | Status: AC

## 2011-09-12 NOTE — Assessment & Plan Note (Signed)
She was reassured that this is not heart related.

## 2011-09-12 NOTE — Assessment & Plan Note (Signed)
Partner had aspermia on semen analysis

## 2011-09-12 NOTE — Assessment & Plan Note (Signed)
GC/Chlamydia is pending. Because of the chronicity, I still suspect a psychological trauma. Past cultures and ultrasound have been normal.

## 2011-09-12 NOTE — Patient Instructions (Addendum)
El electrocardiogram salio' normal. Parece que el dolor del pecho no es algo peligroso.  No tiene una infeccion vaginal. Un cultivo para infeccion del cuello de la matiz y de la orina va a Hotel manager en 2-3 dias.   Dr Neville Route a llamarle con los [place].   I will call her with the GC/Chlamydia results.

## 2011-09-12 NOTE — Progress Notes (Signed)
  Subjective:    Patient ID: Traci Porter, female    DOB: 23-Aug-1990, 23 y.o.   MRN: [id number]  HPI 3 weeks of intermittent brief sharp chest pains in the left chest deep to her breast with radiation to the left shoulder. It is 7/10 and she can't move until it passes. They come about twice weekly in any position or time of day. Not associated with meals, palpitations or particular movements. They last only a few minutes. The is mild shortness of breath associated, but no wheezing.  She has tried Ibuprofen but it is difficult to say if it relieved the pain. Denies GERD symptoms or recent stress.   She continues deep pelvic pain with sexual relations and has avoided these for the past 2 months. There is itching afterward. No recent vaginal discharge and no change after taking the Cephalexin for UTI las month. Her partner doesn't have symptoms.  She continues to want another pregnancy. I didn't bring up her partner, Eulogio Bear aspermia, which we discussed last year. She denies ever having been raped or otherwise traumatized pelvically.   She continues urinary urgency and nocturia x 1-2, but no dysuria.   Her menses continue to be irregular, the last coming August 04, 2011  She has lost 6 pounds without trying.   Her headaches are now rare, even thought she stopped the Amitriptyline.   Review of Systems     see HPI for ROS  Objective:   Physical Exam  Constitutional: She appears well-developed and well-nourished.  Cardiovascular: Normal rate and regular rhythm.   No murmur heard. Pulmonary/Chest: Effort normal and breath sounds normal.  Abdominal: Soft. Bowel sounds are normal.  Genitourinary: Vagina normal and uterus normal. No vaginal discharge found.       Uterus retroflexed, normal size and adnexa normal to palpation. No cervical motion tenderness.   Psychiatric: She has a normal mood and affect. Her behavior is normal. Thought content normal.          Assessment & Plan:

## 2011-09-12 NOTE — Assessment & Plan Note (Signed)
Urine dipstick was negative. Last culture had enterococcus as one of 2 organisms, but was not a good specimen. GC/Chlamydia is pending.

## 2011-09-16 ENCOUNTER — Encounter: Payer: Self-pay | Admitting: Family Medicine

## 2011-12-30 ENCOUNTER — Ambulatory Visit (INDEPENDENT_AMBULATORY_CARE_PROVIDER_SITE_OTHER): Payer: Self-pay | Admitting: Family Medicine

## 2011-12-30 ENCOUNTER — Encounter: Payer: Self-pay | Admitting: Family Medicine

## 2011-12-30 VITALS — BP 109/70 | HR 89 | Temp 98.6°F | Ht 60.75 in | Wt 136.0 lb

## 2011-12-30 DIAGNOSIS — R110 Nausea: Secondary | ICD-10-CM

## 2011-12-30 DIAGNOSIS — N644 Mastodynia: Secondary | ICD-10-CM | POA: Insufficient documentation

## 2011-12-30 DIAGNOSIS — Z23 Encounter for immunization: Secondary | ICD-10-CM

## 2011-12-30 MED ORDER — NAPROXEN 500 MG PO TABS: 500.0000 mg | ORAL_TABLET | Freq: Two times a day (BID) | ORAL | Status: DC

## 2011-12-30 NOTE — Assessment & Plan Note (Signed)
Mastalgia generalized, more focal along L lateral breast with mild fullness noted but no redness/warmth/adenopathy.  Does not appear infectious.  Will plan for short course of antiinflammatories, continue warm compresses, use supportive bra.  If not better, to consider US imaging.  Patient is 21 yrs old without focal findings and no history breast cancer, therefore will not pursue mammography at this time.  She is instructed to call if worsening or not better.

## 2011-12-30 NOTE — Patient Instructions (Addendum)
Fue un Research officer, trade union.  La prueba de embarazo hoy salio' negativa.  Quiero que use compresas tibias O frias cada rato; tambien un sosten para darle soporte a los senos.   Mande' una receta para NAPROXEN 500mg  dos veces por dia, a su farmacia en [place] y [employer].  Si no esta' mejor, si tiene fiebres o si nota la formacion de una bolita en el seno, quiero que me llame al comienzo de la semana que viene.  Es posible que tengamos que considerar un ultrasonido en ese caso.

## 2011-12-30 NOTE — Progress Notes (Signed)
  Subjective:    Patient ID: Traci Porter, female    DOB: 1990/06/28, 21 y.o.   MRN: [id number]  HPI Visit in Spanish.  Maurianna comes in for complaint of pain in both breasts, started on L lateral breast and has felt more full along that side.  That was 10 days ago.  Over the next few days it extended to include the R breast as well.  No nipple discharge.  No redness.  Using tea-soaked bags as warm compresses (flor de arnica).  LMP Aug 21; she has a negative pregnancy test today.   She has not felt any discrete nodules or masses in her breast or axillae.  Review of Systems Occasional mild headaches like her usual.  Drinks one cup coffee per day, no other caffeinated beverages.  No fevers or chills.  No family hx of breast cancer.     Objective:   Physical Exam Well appearing, no apparent distrress HEENT Neck supple, no anterior cervical adenopathy or supraclavicular adenopathy.  BREASTS: Symmetric and without redness. No nipple redness or discharge.  No axillary adenopathy bilaterally. No focal masses.  Tenderness to palpate L breast along lateral edge.        Assessment & Plan:

## 2012-02-06 ENCOUNTER — Encounter: Payer: Self-pay | Admitting: Family Medicine

## 2012-02-06 ENCOUNTER — Ambulatory Visit (INDEPENDENT_AMBULATORY_CARE_PROVIDER_SITE_OTHER): Payer: Self-pay | Admitting: Family Medicine

## 2012-02-06 VITALS — BP 115/73 | HR 80 | Ht 60.75 in | Wt 136.0 lb

## 2012-02-06 DIAGNOSIS — N926 Irregular menstruation, unspecified: Secondary | ICD-10-CM

## 2012-02-06 DIAGNOSIS — R42 Dizziness and giddiness: Secondary | ICD-10-CM | POA: Insufficient documentation

## 2012-02-06 LAB — CBC
MCHC: 34.3 g/dL (ref 30.0–36.0)
MCV: 85.7 fL (ref 78.0–100.0)
Platelets: 274 10*3/uL (ref 150–400)
RDW: 13.6 % (ref 11.5–15.5)
WBC: 10.7 10*3/uL — ABNORMAL HIGH (ref 4.0–10.5)

## 2012-02-06 NOTE — Progress Notes (Signed)
Interpreter Bradin Mcadory [facility] for [facility] 

## 2012-02-06 NOTE — Progress Notes (Signed)
Subjective:     Patient ID: Traci Porter, female   DOB: 06/24/1990, 21 y.o.   MRN: [id number]  HPI  1. Weakness / Dizziness: Patient reports feeling "weakness in her entire body" and "dizziness when she moves" that started last Wednesday. The feelings have been constant for about 3 days, but have mildly improved by today. Dizziness is made worse by standing, and difficulty with balance, but denies any falls, LoC, or vertigo. Admits to mild frontal HA with pulsations. Not tried any medications.  2. Irregular Menstrual Periods: Reports hx of irregular menstrual cycle with heavy bleeding. Noted having periods in March/April 2013, and then no periods until August and September 2013. LMP 01/02/12 heavy bleeding x3 days. No contraception, would like to get pregnant.  Review of Systems  Denies any recent fever, chills, vertigo, vision changes, focal weakness/neuro deficits.     Objective:   Physical Exam  BP 115/73  Pulse 80  Ht 5' 0.75" (1.543 m)  Wt 136 lb (61.689 kg)  BMI 25.91 kg/m2  LMP 01/02/2012 General appearance: alert, cooperative and no distress Head: Normocephalic, without obvious abnormality, atraumatic Eyes: conjunctivae/corneas clear. PERRL, EOM's intact. Fundi benign. Ears: normal TM's and external ear canals both ears Neck: no adenopathy, supple, symmetrical, trachea midline and thyroid not enlarged, symmetric, no tenderness/mass/nodules Lungs: clear to auscultation bilaterally Heart: regular rate and rhythm, S1, S2 normal, no murmur, click, rub or gallop Pulses: 2+ and symmetric Neurologic: Alert and oriented X 3, normal strength and tone. Normal symmetric reflexes. Normal coordination and gait     Assessment & Plan:     1. Weakness/Dizziness - possible blood loss anemia (2/2 irregular menstrual cycle w/ menorrhagia. See below) - possible dehydration. Negative Orthostatic VS. Normal increase HR ( 69 to 80), no significant change in BP 119/70 (sitting) to 115/73  (standing). - Possible pregnancy. No contraception. About >1 month since LMP. Urine Pregnancy test negative. - Unlikely Benign Paroxysmal Positional Vertigo (BPPV). Denies vertigo, +imbalance  2. Irregular Menstrual Cycle - Hx irregular periods w/ menorrhagia, LMP 01/02/12 - Will check a CBC to evaluate H/H. Last CBC (4/13, 13.7 / 43.0). Pending results will call patient back with results if abnormal and need for further treatment, possible iron supplement

## 2012-02-06 NOTE — Assessment & Plan Note (Addendum)
Weakness/Dizziness-Some improvement since onset 3 days ago.   - possible blood loss anemia (2/2 irregular menstrual cycle w/ menorrhagia. See below) - possible dehydration. Negative Orthostatic VS. Normal increase HR ( 69 to 80), no significant change in BP 119/70 (sitting) to 115/73 (standing). - Possible pregnancy. No contraception. About >1 month since LMP. Urine Pregnancy test negative. - Unlikely Benign Paroxysmal Positional Vertigo (BPPV). Denies vertigo, +imbalance

## 2012-02-06 NOTE — Assessment & Plan Note (Signed)
Irregular Menstrual Cycle - Hx irregular periods w/ menorrhagia, LMP 01/02/12 - Will check a CBC to evaluate H/H. Last CBC (4/13, 13.7 / 43.0). Pending results will call patient back with results if abnormal and need for further treatment, possible iron supplement

## 2012-02-07 ENCOUNTER — Telehealth: Payer: Self-pay | Admitting: Family Medicine

## 2012-02-07 NOTE — Telephone Encounter (Signed)
I called Traci Porter and gave her the normal CBC results. She is feeling better today. We communicated in Spanish

## 2012-02-07 NOTE — Patient Instructions (Addendum)
Tomar bastante agua  Regresar si no mejoran las sintomas en pocas dias.

## 2012-02-20 ENCOUNTER — Encounter: Payer: Self-pay | Admitting: Family Medicine

## 2012-02-20 ENCOUNTER — Ambulatory Visit (INDEPENDENT_AMBULATORY_CARE_PROVIDER_SITE_OTHER): Payer: Self-pay | Admitting: Family Medicine

## 2012-02-20 VITALS — BP 131/88 | HR 82 | Ht 60.75 in | Wt 136.7 lb

## 2012-02-20 DIAGNOSIS — N979 Female infertility, unspecified: Secondary | ICD-10-CM

## 2012-02-20 DIAGNOSIS — R195 Other fecal abnormalities: Secondary | ICD-10-CM

## 2012-02-20 NOTE — Progress Notes (Signed)
  Subjective:    Patient ID: Traci Porter, female    DOB: May 31, 1990, 21 y.o.   MRN: [id number]  HPI  [facility], however she has had an negative pap in 02/2010.  She agrees to return in 2014 for pap.  Patient complains of strong odor of stools.  This has been going on for about 2 months.  Patient says her BM smell "like a dead animal."  Denies any constipation, diarrhea, or bloody stools.  She has one BM every other day, consistency is soft.  Patient denies any recent antibiotic use or hospitalizations.  She complains of associated abdominal cramping prior to having a BM, but this resolves after BM.  Patient denies any use of stool softeners or new medications.     Review of Systems  Denies any dysuria, urinary frequency, vaginal discharge, or vaginal bleeding.  Denies any fever.    Objective:   Physical Exam  Constitutional: She appears well-nourished. No distress.  Abdominal: Soft. Bowel sounds are normal. She exhibits no distension. There is no tenderness. There is no rebound and no guarding.       Assessment & Plan:

## 2012-02-20 NOTE — Patient Instructions (Addendum)
Usted puede tratar de evitar la carne roja por Traci Porter y ver si las heces falta se pone mejor. Por favor, llame al mdico si presenta sangre en las heces, diarrea o empeoramiento del dolor abdominal. Programe cita de seguimiento en un ao para su desarrollo fsico anual con Papanicolaou.

## 2012-02-20 NOTE — Assessment & Plan Note (Signed)
Patient denies any diarrhea - strong odor likely physiologic, less likely infectious vs. Parasitic.  Patient reassured.

## 2012-02-20 NOTE — Progress Notes (Signed)
Interpreter Wyvonnia Dusky for [employer]

## 2012-02-21 NOTE — Assessment & Plan Note (Signed)
Her menses have been regular the past 2 months. Her partner wasn't able to follow up with urology though he has BC/BS. I put in another order to [facility] for him to go there for another sperm sample after 3 days without sexual relations written on an Rx. If it again shows aspermia, pregnancy won't be possible

## 2012-05-01 ENCOUNTER — Ambulatory Visit (INDEPENDENT_AMBULATORY_CARE_PROVIDER_SITE_OTHER): Payer: No Typology Code available for payment source | Admitting: Family Medicine

## 2012-05-01 ENCOUNTER — Other Ambulatory Visit (HOSPITAL_COMMUNITY)
Admission: RE | Admit: 2012-05-01 | Discharge: 2012-05-01 | Disposition: A | Discharge status: Home / Self Care | Payer: No Typology Code available for payment source | Source: Ambulatory Visit | Attending: Family Medicine | Admitting: Family Medicine

## 2012-05-01 ENCOUNTER — Encounter: Payer: Self-pay | Admitting: Family Medicine

## 2012-05-01 VITALS — BP 106/71 | HR 62 | Temp 98.4°F | Ht 60.75 in | Wt 133.4 lb

## 2012-05-01 DIAGNOSIS — N940 Mittelschmerz: Secondary | ICD-10-CM

## 2012-05-01 DIAGNOSIS — Z01419 Encounter for gynecological examination (general) (routine) without abnormal findings: Secondary | ICD-10-CM | POA: Insufficient documentation

## 2012-05-01 DIAGNOSIS — Z124 Encounter for screening for malignant neoplasm of cervix: Secondary | ICD-10-CM

## 2012-05-01 DIAGNOSIS — N912 Amenorrhea, unspecified: Secondary | ICD-10-CM

## 2012-05-01 NOTE — Progress Notes (Signed)
  Subjective:    Patient ID: Traci Porter, female    DOB: 1990-11-22, 22 y.o.   MRN: [id number]  HPI  Visit in Spanish.  Traci Porter presents with c/o bilateral pelvic pain and crampiness that is worse in mid-cycle. LMP Mar 14, 2012.  Her menses then lasted about 3 days, as per her usual.  She desires pregnancy.  She checked a home pregnancy test 3 days ago and was negative.  Denies vaginal bleeding or discharge.  No dysuria or changes in bowel habits.  No constipation.   Recently started working in cleaning; had a workplace health exam and had a positive ppd skin test and went for CXR, but does not know the results.  Done at "[facility]" on [address].  Denies cough or hemoptysis, or fevers.  No known TB contacts.     Review of Systems See HPI     Objective:   Physical Exam Well appearing, no apparent distress HEENT Neck supple.  ABD Soft, nontender.  GYN Normal vaginal mucosa, cervical os non-friable. Bimanual without CMT; there is mild adnexal tenderness on the R greater than L.        Assessment & Plan:

## 2012-05-01 NOTE — Assessment & Plan Note (Signed)
Patient with pelvic crampy pain halfway through menstrual cycle, indicative of mittelschmerz.  She has no CMT on exam, no vaginal discharge.  She desire pregnancy, thus would not initiate hormonal suppression of ovulation at this time.  Will treat with NSAIDs during period of maximal discomfort and see if resolves when she achieves pregnancy.  She had a transvaginal US done in Nov 2011 which did not show another cause for her pelvic pain.  Also consider endometriosis, however the timing of her discomfort is more in keeping with mittelschmerz.

## 2012-05-01 NOTE — Patient Instructions (Addendum)
Fue un Research officer, trade union.  Creo que el dolor bajovientre se debe a un proceso que se llama de Radio producer, que es dolor en la hora de la ovulacion.   Puede tomar Aleve (Naproxen) 500mg  por boca, cada 12 horas, en el tiempo del ciclo cuando mas le afecta.   Quiero verle de nuevo en 3 meses.   FOLLOW UP WITH DR Mauricio Po IN 3 MONTHS.   TO WHOM IT MAY CONCERN IN WORK HEALTH,  Traci Porter IS MY PATIENT AND SHE REQUESTS A COPY OF HER CHEST XRAY THAT WAS DONE FOR TB SCREENING PURPOSES.  PLEASE FURNISH A COPY OF THE RESULTS TO HER. THANK YOU,  Paula Compton, MD

## 2012-05-04 ENCOUNTER — Encounter: Payer: Self-pay | Admitting: Family Medicine

## 2012-08-31 ENCOUNTER — Other Ambulatory Visit (HOSPITAL_COMMUNITY)
Admission: RE | Admit: 2012-08-31 | Discharge: 2012-08-31 | Disposition: A | Discharge status: Home / Self Care | Payer: No Typology Code available for payment source | Source: Ambulatory Visit | Attending: Family Medicine | Admitting: Family Medicine

## 2012-08-31 ENCOUNTER — Ambulatory Visit (INDEPENDENT_AMBULATORY_CARE_PROVIDER_SITE_OTHER): Payer: No Typology Code available for payment source | Admitting: Family Medicine

## 2012-08-31 ENCOUNTER — Encounter: Payer: Self-pay | Admitting: Family Medicine

## 2012-08-31 VITALS — BP 108/72 | HR 65 | Temp 98.4°F | Ht 60.75 in | Wt 136.0 lb

## 2012-08-31 DIAGNOSIS — N72 Inflammatory disease of cervix uteri: Secondary | ICD-10-CM

## 2012-08-31 DIAGNOSIS — Z113 Encounter for screening for infections with a predominantly sexual mode of transmission: Secondary | ICD-10-CM | POA: Insufficient documentation

## 2012-08-31 DIAGNOSIS — N898 Other specified noninflammatory disorders of vagina: Secondary | ICD-10-CM

## 2012-08-31 DIAGNOSIS — N926 Irregular menstruation, unspecified: Secondary | ICD-10-CM

## 2012-08-31 LAB — POCT WET PREP (WET MOUNT): Clue Cells Wet Prep Whiff POC: NEGATIVE

## 2012-08-31 LAB — POCT URINE PREGNANCY: Preg Test, Ur: NEGATIVE

## 2012-08-31 LAB — BASIC METABOLIC PANEL
BUN: 11 mg/dL (ref 6–23)
Calcium: 9.3 mg/dL (ref 8.4–10.5)
Creat: 0.7 mg/dL (ref 0.50–1.10)

## 2012-08-31 LAB — TSH: TSH: 1.034 u[IU]/mL (ref 0.350–4.500)

## 2012-08-31 LAB — PROLACTIN: Prolactin: 12.9 ng/mL

## 2012-08-31 NOTE — Patient Instructions (Addendum)
Fue un Research officer, trade union.  Le llamo al [phone] con los resultados de los estudios que hicimos hoy.  Sigue marcando en [employer] dias en que te dan sintomas de dolor de [place], [place] de [place], u otros sintomas.   FOLLOW UP WITH DR Mauricio Po IN 2 MONTHS>

## 2012-08-31 NOTE — Assessment & Plan Note (Signed)
Irregular menses, Upreg done today to rule out possible pregnancy.  Upreg negative.  Cervical cultures collected.  To check TSH , FSH, total testosterone, A1C and prolactin levels.  I called patient's cell [phone] and left message about office labs.  Will call back with results of cultures and blood work.

## 2012-08-31 NOTE — Progress Notes (Signed)
  Subjective:    Patient ID: Traci Porter, female    DOB: 07/22/1990, 22 y.o.   MRN: [id number]  HPI Visit in Spanish . Traci Porter presents with continued pelvic discomfort that is cyclical; accompanied by soreness and swelling in breasts.  No nipple discharge.  LMP Mar 22, 2013, lasted 4 days.  Menarche began at age 49, was always very regular before the birth of her son.  She is G1P1.  Had implanon after delivery, but [facility] because of headaches and amenorrhea, which she did not like.  Is interested in becoming pregnant.  Last home pregnancy test was 3 days ago and negative.   Began with dark blood per vagina today, and is having the cramping now.  No dysuria.  No polyuria.  No hematochezia.  Some mild constipation.      Review of Systems See HPI. No vaginal discharge.  Has dyspareunia.    Objective:   Physical Exam Well appearing, no apparent distress HEENT Neck supple. No cervical adenopathy.  COR Regular S1S2 PULM Clear bilat.  ABD Soft, nontender. Nondistended.  GU: Normal external genitalia.  Vaginal mucosa normal. Copious dark blood and clots from internal os, in fornix.  No lesions noted in cervical mucosa.  Bimanual exam with bilateral adnexal tenderness, no masses noted. Cervical culture and wet prep collected.        Assessment & Plan:

## 2012-09-04 LAB — TESTOSTERONE, FREE, TOTAL, SHBG
Sex Hormone Binding: 42 nmol/L (ref 18–114)
Testosterone: 41 ng/dL (ref 10–70)

## 2012-09-05 ENCOUNTER — Encounter: Payer: Self-pay | Admitting: Family Medicine

## 2012-11-13 ENCOUNTER — Encounter: Payer: Self-pay | Admitting: Family Medicine

## 2012-11-13 ENCOUNTER — Ambulatory Visit (INDEPENDENT_AMBULATORY_CARE_PROVIDER_SITE_OTHER): Payer: No Typology Code available for payment source | Admitting: Family Medicine

## 2012-11-13 VITALS — BP 111/74 | HR 70 | Ht 60.75 in | Wt 141.0 lb

## 2012-11-13 DIAGNOSIS — M533 Sacrococcygeal disorders, not elsewhere classified: Secondary | ICD-10-CM

## 2012-11-13 DIAGNOSIS — N926 Irregular menstruation, unspecified: Secondary | ICD-10-CM

## 2012-11-13 DIAGNOSIS — M545 Low back pain, unspecified: Secondary | ICD-10-CM

## 2012-11-13 DIAGNOSIS — M549 Dorsalgia, unspecified: Secondary | ICD-10-CM

## 2012-11-13 LAB — POCT URINALYSIS DIPSTICK
Leukocytes, UA: NEGATIVE
Protein, UA: NEGATIVE
Urobilinogen, UA: 0.2
pH, UA: 7

## 2012-11-13 LAB — POCT URINE PREGNANCY: Preg Test, Ur: NEGATIVE

## 2012-11-13 MED ORDER — NAPROXEN 500 MG PO TABS: 500.0000 mg | ORAL_TABLET | Freq: Two times a day (BID) | ORAL | Status: DC

## 2012-11-13 MED ORDER — CYCLOBENZAPRINE HCL 10 MG PO TABS: 10.0000 mg | ORAL_TABLET | Freq: Three times a day (TID) | ORAL | Status: DC | PRN

## 2012-11-13 NOTE — Progress Notes (Signed)
Subjective:     Patient ID: Traci Porter, female   DOB: 10/07/1990, 22 y.o.   MRN: [id number]  HPI This is a 22 yo woman w/ hx or irreg menstrual cycle c/o increasing back pain of 2 wks duration. The pain is localized paraspinous from her lumbar region up to the level of the scapula. She describes it as a pulsating pain that is on both sides of her back that worsens with sitting, twisting, and reaching for objects. She experiences the pain all day and is currently taking Flanax (Naproxen) that she received from [place]. The Flanax decreases her pain for a period of 6 hrs after which it returns.  No associated trauma or injury associated with the back pain.  No previous hx of similar back pain.   LMP was this past March.   Review of Systems General: No pain elsewhere in body  GI: No diarrhea  GU: Endorses dysuria and dark-colored urine. Denies hematuria and stones in urine.  Attending Note; Patient visit conducted in Spanish, with Donnal Moat MS3 [facility], and I agree with his note.  Traci Porter reports bilateral low back pain that radiates up to just below scapulae bilaterally.  Worse with changes in position and with movement.  No recent trauma.  Stays at home with son, whom she does not carry.  Has taken [place] brand of Naproxen (Flanax) which a family member sends from [place].  No LE weakness. Has been able to walk without difficulty.  ROS: No fevers or chills. LMP March 2013, no hormonal birth control.  Endorses concentrated urine and perhaps some mild burning with urination. No vaginal discharge.  Had cervical cultures done at last visit which were negative, discussed results today as well. (She had received a letter with results). No history of renal calculi.  No blood in urine. JB     Objective:   Physical Exam General: NAD. A&Ox3. Speaks in full sentences and answers questions appropriately.   CV: Radial pulses 2+ bilaterally.  GI: Soft, non-tender, non-distended abdomen. No rebound or  guarding.  Back: Full ROM. Tenderness to palpation along paraspinous regions and medial portion of scapula. No CVA tenderness.   Extremities: Ambulates freely in room w/o signs of distress.  UA: unremarkable UA preg: negative  Exam: Pleasant, no apparent distress. Gets up to exam table without difficulty.  HEENT Neck supple.  MSK: Tenderness to palpate along paraspinous mm bilaterally from LS region to scapulae.  Full ROM shoulders and UEs.  No CVA tenderness.  ABD Soft, nontender. No masses. Neg CVA tenderness. JB    Assessment:     This is a 22 yo woman with paraspinous pain consistent w/ musculoskeletal strain.    Plan:     1. Back muscle strain - Given normal urinalysis, no hx of kidney stones, and region of experienced pain, MSK involvement is most likely. Will prescribe Naproxen 500 mg q12hr PRN for pain and Cyclobenzaprine 10 mg qday at night. Additionally, pt given back exercises/stretches to help strengthen paraspinous muscles and advised to use heat to help w/ circulation and pain on her back. F/u in 2-4 wks if no improvement or sooner if condition worsens.

## 2012-11-13 NOTE — Assessment & Plan Note (Signed)
Strongly suspect MSK.  UA and UPreg tests negative for infectious findings, hematuria or pregnancy.  Treatment with NSAID, heat, exercises. Discussed plan in Spanish in great detail.  Patient to call/come back if not improving in the coming 4 weeks (reminded her of slow rate of progress of low back pain). JB

## 2012-11-13 NOTE — Patient Instructions (Addendum)
Fue un Marketing executive.  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  Quiero que tome Naproxen 500mg , una tableta cada 12 horas, por los proximos 7 dias.  Despues lo puede tomar cada 12 horas segun necesite.   Estoy recetandole un relajante muscular que se llama de Flexeril 10mg , una tableta en la noche antes de dormir.  Puede provocar sueno, asi que no se la tome durante el dia.  Ejercicios para [employer] de la espalda, varias veces por dia.  Bolsas de agua caliente varios veces por dia.  Por favor llame si no esta' mejor dentro de 2 a 4 semanas, o si se Personal assistant.

## 2012-12-04 ENCOUNTER — Encounter: Payer: Self-pay | Admitting: Family Medicine

## 2012-12-04 ENCOUNTER — Ambulatory Visit (INDEPENDENT_AMBULATORY_CARE_PROVIDER_SITE_OTHER): Payer: No Typology Code available for payment source | Admitting: Family Medicine

## 2012-12-04 VITALS — BP 111/70 | HR 70 | Temp 98.1°F | Ht 60.75 in | Wt 140.0 lb

## 2012-12-04 DIAGNOSIS — M542 Cervicalgia: Secondary | ICD-10-CM

## 2012-12-04 MED ORDER — NORTRIPTYLINE HCL 25 MG PO CAPS: 25.0000 mg | ORAL_CAPSULE | Freq: Every day | ORAL | Status: DC

## 2012-12-04 NOTE — Patient Instructions (Addendum)
Fue un Research officer, trade union.  Para el dolor quiero que tome NORTRIPTYLINE 25mg , una capsula antes de acostarse todas las noches.   Si no esta' mejor en 2 semanas, puede ir para sacarse una placa al cuello (la orden esta' hecha en [facility]).

## 2012-12-05 NOTE — Assessment & Plan Note (Signed)
Some concern for cervical radiculopathy with description and today's exam.  No motor weakness or sensory deficits.  Plan for evening nortriptyline to help with pain, headaches and sleep.  Re-evaluation in 3-4 weeks or sooner if worsening.

## 2012-12-05 NOTE — Progress Notes (Signed)
  Subjective:    Patient ID: Traci Porter, female    DOB: Aug 25, 1990, 22 y.o.   MRN: [id number]  HPI Visit conducted in Spanish.  Has continued with left upper back pain since her last visit earlier this month.  The lower (L-S) back pain is resolved since last visit.  She has been awakened at night by the pain, which is worse early in the morning.  Pain extends across the back pf the L shoulder and into the base of the neck.  Has some associated headache in the mornings on occasion.  No weakness in the upper extremities.  No trauma.  She is right-hand dominant.  Traci Porter also explains that she is intending to get pregnant; she is a G1P1 with one prior uncomplicated term pregnancy.  She has irregular menses and thus is unsure about timing her ovulation.     Review of Systems See HPI    Objective:   Physical Exam Well appearing, no apparent distress HEENT neck supple, no cervical adenopathy. Spurlings test positive when neck turned to the left.  Strength in UEs are full and symmetric. Handgrip full and symmetric bilaterally.  Palpable arterial pulses bilaterally. Sensation grossly intact bilaterally.        Assessment & Plan:

## 2012-12-06 ENCOUNTER — Encounter: Payer: Self-pay | Admitting: Family Medicine

## 2012-12-26 ENCOUNTER — Ambulatory Visit
Admission: RE | Admit: 2012-12-26 | Discharge: 2012-12-26 | Disposition: A | Discharge status: Home / Self Care | Payer: No Typology Code available for payment source | Source: Ambulatory Visit | Attending: Family Medicine | Admitting: Family Medicine

## 2012-12-26 DIAGNOSIS — M542 Cervicalgia: Secondary | ICD-10-CM

## 2012-12-28 ENCOUNTER — Telehealth: Payer: Self-pay | Admitting: Family Medicine

## 2012-12-28 DIAGNOSIS — M542 Cervicalgia: Secondary | ICD-10-CM

## 2012-12-28 NOTE — Telephone Encounter (Signed)
I called to report results of c-spine films, which are unremarkable. Call completed in Spanish.  She reports that the scapular/neck pain is worse, does not let her sleep well.  Plan for referral for PT evaluation and treatment as indicated. She is to come back to see me after their evaluation. Paula Compton, MD

## 2013-01-14 ENCOUNTER — Ambulatory Visit: Payer: No Typology Code available for payment source | Attending: Family Medicine

## 2013-01-17 ENCOUNTER — Ambulatory Visit: Payer: No Typology Code available for payment source

## 2013-01-21 ENCOUNTER — Ambulatory Visit: Payer: No Typology Code available for payment source | Admitting: Family Medicine

## 2013-02-26 ENCOUNTER — Ambulatory Visit: Payer: Self-pay | Admitting: Family Medicine

## 2013-03-12 ENCOUNTER — Emergency Department (HOSPITAL_COMMUNITY)
Admission: EM | Admit: 2013-03-12 | Discharge: 2013-03-12 | Disposition: A | Discharge status: Home / Self Care | Payer: Medicaid Other | Attending: Emergency Medicine | Admitting: Emergency Medicine

## 2013-03-12 ENCOUNTER — Encounter (HOSPITAL_COMMUNITY): Payer: Self-pay | Admitting: Emergency Medicine

## 2013-03-12 ENCOUNTER — Encounter: Payer: Self-pay | Admitting: Family Medicine

## 2013-03-12 ENCOUNTER — Ambulatory Visit (INDEPENDENT_AMBULATORY_CARE_PROVIDER_SITE_OTHER): Payer: Self-pay | Admitting: Family Medicine

## 2013-03-12 VITALS — BP 111/79 | HR 73 | Temp 98.2°F | Ht 60.75 in | Wt 131.3 lb

## 2013-03-12 DIAGNOSIS — R45851 Suicidal ideations: Secondary | ICD-10-CM | POA: Insufficient documentation

## 2013-03-12 DIAGNOSIS — Z8742 Personal history of other diseases of the female genital tract: Secondary | ICD-10-CM | POA: Insufficient documentation

## 2013-03-12 DIAGNOSIS — F32A Depression, unspecified: Secondary | ICD-10-CM | POA: Diagnosis present

## 2013-03-12 DIAGNOSIS — F329 Major depressive disorder, single episode, unspecified: Secondary | ICD-10-CM | POA: Diagnosis present

## 2013-03-12 DIAGNOSIS — N390 Urinary tract infection, site not specified: Secondary | ICD-10-CM | POA: Insufficient documentation

## 2013-03-12 DIAGNOSIS — Z79899 Other long term (current) drug therapy: Secondary | ICD-10-CM | POA: Insufficient documentation

## 2013-03-12 DIAGNOSIS — R51 Headache: Secondary | ICD-10-CM | POA: Insufficient documentation

## 2013-03-12 DIAGNOSIS — F3289 Other specified depressive episodes: Secondary | ICD-10-CM | POA: Insufficient documentation

## 2013-03-12 DIAGNOSIS — R300 Dysuria: Secondary | ICD-10-CM | POA: Insufficient documentation

## 2013-03-12 LAB — RAPID URINE DRUG SCREEN, HOSP PERFORMED
Amphetamines: NOT DETECTED
Benzodiazepines: NOT DETECTED
Cocaine: NOT DETECTED
Opiates: NOT DETECTED

## 2013-03-12 LAB — URINE MICROSCOPIC-ADD ON

## 2013-03-12 LAB — CBC
HCT: 43.3 % (ref 36.0–46.0)
MCH: 30.6 pg (ref 26.0–34.0)
MCHC: 34.6 g/dL (ref 30.0–36.0)
MCV: 88.4 fL (ref 78.0–100.0)
Platelets: 266 10*3/uL (ref 150–400)
RDW: 12.9 % (ref 11.5–15.5)
WBC: 7.5 10*3/uL (ref 4.0–10.5)

## 2013-03-12 LAB — URINALYSIS, ROUTINE W REFLEX MICROSCOPIC
Glucose, UA: NEGATIVE mg/dL
Ketones, ur: NEGATIVE mg/dL
Protein, ur: 30 mg/dL — AB
pH: 6 (ref 5.0–8.0)

## 2013-03-12 LAB — ETHANOL: Alcohol, Ethyl (B): <11 mg/dL (ref 0–11)

## 2013-03-12 LAB — POCT URINALYSIS DIPSTICK
Glucose, UA: NEGATIVE
Ketones, UA: NEGATIVE
Spec Grav, UA: >=1.03
Urobilinogen, UA: 0.2

## 2013-03-12 LAB — POCT UA - MICROSCOPIC ONLY

## 2013-03-12 LAB — COMPREHENSIVE METABOLIC PANEL
AST: 18 U/L (ref 0–37)
Albumin: 4.5 g/dL (ref 3.5–5.2)
BUN: 11 mg/dL (ref 6–23)
Calcium: 9.5 mg/dL (ref 8.4–10.5)
Chloride: 105 mEq/L (ref 96–112)
Creatinine, Ser: 0.62 mg/dL (ref 0.50–1.10)
Total Protein: 8.1 g/dL (ref 6.0–8.3)

## 2013-03-12 LAB — SALICYLATE LEVEL: Salicylate Lvl: <2 mg/dL — ABNORMAL LOW (ref 2.8–20.0)

## 2013-03-12 MED ORDER — NITROFURANTOIN MONOHYD MACRO 100 MG PO CAPS
100.0000 mg | ORAL_CAPSULE | Freq: Two times a day (BID) | ORAL | Status: DC
Start: 2013-03-12 — End: 2013-03-12
  Administered 2013-03-12: 100 mg via ORAL
  Filled 2013-03-12 (×2): qty 1

## 2013-03-12 NOTE — Consult Note (Signed)
Note reviewed and agreed with  

## 2013-03-12 NOTE — ED Notes (Signed)
Tele assessment attempted. Computer screen freezing up. Face to face assessment with be completed.

## 2013-03-12 NOTE — Progress Notes (Signed)
Interpreter Zamir Staples Namihira for Dr Fletke 

## 2013-03-12 NOTE — Assessment & Plan Note (Signed)
Patient is actively having thoughts of committing suicide. She has thought of crashing her car to commit suicide. -Spoke with triage nurse at behavioral health at [facility] and she states that patient needs to be transferred to [facility] ER for further evaluation/medical clearance. -Patient is agreeable for transfer to [facility] ER for evaluation

## 2013-03-12 NOTE — ED Notes (Addendum)
Per EMS: from dr's office, doctor states she is dealing with depression and UTI. Pt calm the whole transport.

## 2013-03-12 NOTE — ED Notes (Signed)
Pt husband here to visit. TTS called and made aware pt was on unit.

## 2013-03-12 NOTE — ED Notes (Signed)
Pt calm and  interactive with staff. Pt does speak [place]. Pt providing  a urine sample as requested. Pt oriented to unit. Drink given.

## 2013-03-12 NOTE — Progress Notes (Addendum)
   Subjective:    Patient ID: Traci Porter, female    DOB: [birth year], 22 y.o.   MRN: [id number]  HPI 22 year old Hispanic female presents for increasing "sleepiness" for the past few weeks, she states that she will stay in bed for the majority of the day, has little energy to perform her daily activities, she states that her mood is depressed, has decreased concentration, she has racing thoughts at night, she states that she is concerned about her desire to separate from her current partner, denies appetite or weight change, she currently lives with her six-year-old son, she admits to suicidal ideation, she is actively thought about crashing her car to kill herself, she is currently having these symptoms. She denies previous history of depression, has not been previously treated for depression, is not currently on depression medications, no hallucinations  Traci Porter has a six-year-old son, she currently has a boyfriend of 6 years whom she feels that she would like to separate from, she is unable to expand further on why she would like to separate from him, she is from [place]  She also relates a history of dysuria for the past 2 days, no abdominal pain, no fevers or chills, no vaginal discharge   Review of Systems  Constitutional: Negative for fever, chills and fatigue.  Respiratory: Negative for cough and choking.   Cardiovascular: Negative for chest pain.  Genitourinary: Positive for dysuria.  Psychiatric/Behavioral: Positive for suicidal ideas, self-injury and decreased concentration. Negative for hallucinations. The patient is nervous/anxious.        Objective:   Physical Exam Vitals: Reviewed General: Pleasant Hispanic female, tearful during examination, Hispanic interpreter present Psych: Appropriately dressed, tearful, states mood is depressed, admits to active suicidal thoughts, she has a plan to kill herself by crashing her motor vehicle, no flight of ideas, denies  hallucinations, no delusions  PHQ 9 Score of 13       Assessment & Plan:  Please see problem specific assessment and plan.

## 2013-03-12 NOTE — ED Notes (Signed)
Bed: [facility] Expected date:  Expected time:  Means of arrival:  Comments: Med clearance 

## 2013-03-12 NOTE — ED Notes (Signed)
Report given to Kelly, RN.

## 2013-03-12 NOTE — Assessment & Plan Note (Addendum)
Patient has symptoms consistent with UTI, urine dipstick suspicious for UTI -As patient is being transferred to [facility] ER (for evaluation of suicidal thoughts) she was instructed to inform them that she likely has a UTI and will require antibiotics.

## 2013-03-12 NOTE — ED Provider Notes (Signed)
  Medical screening examination/treatment/procedure(s) were performed by non-physician practitioner and as supervising physician I was immediately available for consultation/collaboration.      Gerhard Munch, MD 03/12/13 [phone]

## 2013-03-12 NOTE — ED Provider Notes (Signed)
Per [facility], patient is ready for D/C  Traci Munch, MD 03/12/13 1555

## 2013-03-12 NOTE — Progress Notes (Signed)
[facility] provided pt with a [facility] Orange Card application, highlighting [facility].  °

## 2013-03-12 NOTE — ED Notes (Signed)
Grey pants, blue shirt, pink bra, green underwear, grey jacket, tan purse, black shoes placed in locker 43.

## 2013-03-12 NOTE — [facility] Counselor (Signed)
Writer attempted to do TA but telepsych machine kept freezing. Writer called Eula Listen to let her know pt would be seen face to face by TTS or [facility] extender. Writer spoke with Ava TTS to let her know TA was unsuccessful.   Evette Cristal, [place] Assessment Counselor

## 2013-03-12 NOTE — Consult Note (Signed)
[facility] Face-to-Face Psychiatry Consult   Reason for Consult:  Depressive Disorder Referring Physician:  EDP  Traci Porter is an 22 y.o. female.  Assessment: AXIS I:  Depressive Disorder NOS AXIS II:  Deferred AXIS III:   Past Medical History  Diagnosis Date  . Chronic constipation   . Infertility associated with anovulation    AXIS IV:  other psychosocial or environmental problems and problems related to social environment AXIS V:  51-60 moderate symptoms  Plan:  No evidence of imminent risk to self or others at present.   Patient does not meet criteria for psychiatric inpatient admission. Supportive therapy provided about ongoing stressors. Discussed crisis plan, support from social network, calling 911, coming to the Emergency Department, and calling Suicide Hotline.  Subjective:   Traci Porter is a 22 y.o. female patient.  HPI:  Patient states that she saw her primary physician today and was telling him "I went to my regular doctor and I was talking about things that has happened and he told me to come to the hospital."  Patient states that she has been wanting to separate from her husband and then she gets confused and wants to stay.  Patient states that she has had thoughts of hurting her self but would never act because of her 30 yr old child.  "Some time I want to hurt myself or be alone; I think all of this started about 2 months ago when I told my husband I wanted to sperate.  I don't know why I want to sperate; I just want to be alone sometime not married."  Patient states that she has a good husband, he isn't abusive or mean.  "I don't know what this means."  Patient states that some days she really feels sad and fatigue and just lays around. States that she does not have crying spells but feels like she wants to cry some times.  Patient denies suicidal/homicidal ideation, psychosis, and paranoia.  Patient states that she would never act on hurting or try to kill herself cause  she has to be there for her child "my child is my life; I would never do anything to leave my child." Patient states that she is able to contract for safety and also interested in outpatient services.  Patient also denies psychiatric history.  Past Psychiatric History: Past Medical History  Diagnosis Date  . Chronic constipation   . Infertility associated with anovulation     reports that she has never smoked. She has never used smokeless tobacco. She reports that she drinks about 0.5 ounces of alcohol per week. She reports that she does not use illicit drugs. No family history on file.         Allergies:  No Known Allergies  ACT Assessment Complete:  No:   Past Psychiatric History: Diagnosis:  Depressive Disorder  Hospitalizations:  Denies  Outpatient Care:  Denies  Substance Abuse Care:  Denies  Self-Mutilation:  Denies  Suicidal Attempts:  Denies  Homicidal Behaviors:  Denies   Violent Behaviors:  Denies   Place of Residence:  [place] Marital Status:  Married Employed/Unemployed:  Employed Education:   Family Supports:  Supportive Objective: Blood pressure 109/71, pulse 66, temperature 97.8 F (36.6 C), temperature source Oral, resp. rate 16, last menstrual period 02/07/2013, SpO2 98.00%.There is no weight on file to calculate BMI. Results for orders placed during the hospital encounter of 03/12/13 (from the past 72 hour(s))  ACETAMINOPHEN LEVEL     Status: None  Collection Time    03/12/13 10:10 AM      Result Value Range   Acetaminophen (Tylenol), Serum <15.0  10 - 30 ug/mL   Comment:            THERAPEUTIC CONCENTRATIONS VARY     SIGNIFICANTLY. A RANGE OF 10-30     ug/mL MAY BE AN EFFECTIVE     CONCENTRATION FOR MANY PATIENTS.     HOWEVER, SOME ARE BEST TREATED     AT CONCENTRATIONS OUTSIDE THIS     RANGE.     ACETAMINOPHEN CONCENTRATIONS     >150 ug/mL AT 4 HOURS AFTER     INGESTION AND >50 ug/mL AT 12     HOURS AFTER INGESTION ARE     OFTEN ASSOCIATED  WITH TOXIC     REACTIONS.  CBC     Status: None   Collection Time    03/12/13 10:10 AM      Result Value Range   WBC 7.5  4.0 - 10.5 K/uL   RBC 4.90  3.87 - 5.11 MIL/uL   Hemoglobin 15.0  12.0 - 15.0 g/dL   HCT 16.1  09.6 - 04.5 %   MCV 88.4  78.0 - 100.0 fL   MCH 30.6  26.0 - 34.0 pg   MCHC 34.6  30.0 - 36.0 g/dL   RDW 40.9  81.1 - 91.4 %   Platelets 266  150 - 400 K/uL  COMPREHENSIVE METABOLIC PANEL     Status: None   Collection Time    03/12/13 10:10 AM      Result Value Range   Sodium 141  135 - 145 mEq/L   Potassium 3.8  3.5 - 5.1 mEq/L   Chloride 105  96 - 112 mEq/L   CO2 26  19 - 32 mEq/L   Glucose, Bld 98  70 - 99 mg/dL   BUN 11  6 - 23 mg/dL   Creatinine, Ser 7.82  0.50 - 1.10 mg/dL   Calcium 9.5  8.4 - 95.6 mg/dL   Total Protein 8.1  6.0 - 8.3 g/dL   Albumin 4.5  3.5 - 5.2 g/dL   AST 18  0 - 37 U/L   ALT 8  0 - 35 U/L   Alkaline Phosphatase 75  39 - 117 U/L   Total Bilirubin 0.6  0.3 - 1.2 mg/dL   GFR calc non Af Amer >90  >90 mL/min   GFR calc Af Amer >90  >90 mL/min   Comment: (NOTE)     The eGFR has been calculated using the CKD EPI equation.     This calculation has not been validated in all clinical situations.     eGFR's persistently <90 mL/min signify possible Chronic Kidney     Disease.  ETHANOL     Status: None   Collection Time    03/12/13 10:10 AM      Result Value Range   Alcohol, Ethyl (B) <11  0 - 11 mg/dL   Comment:            LOWEST DETECTABLE LIMIT FOR     SERUM ALCOHOL IS 11 mg/dL     FOR MEDICAL PURPOSES ONLY  SALICYLATE LEVEL     Status: Abnormal   Collection Time    03/12/13 10:10 AM      Result Value Range   Salicylate Lvl <2.0 (*) 2.8 - 20.0 mg/dL  URINE RAPID DRUG SCREEN (HOSP PERFORMED)     Status: None  Collection Time    03/12/13 10:32 AM      Result Value Range   Opiates NONE DETECTED  NONE DETECTED   Cocaine NONE DETECTED  NONE DETECTED   Benzodiazepines NONE DETECTED  NONE DETECTED   Amphetamines NONE DETECTED  NONE  DETECTED   Tetrahydrocannabinol NONE DETECTED  NONE DETECTED   Barbiturates NONE DETECTED  NONE DETECTED   Comment:            DRUG SCREEN FOR MEDICAL PURPOSES     ONLY.  IF CONFIRMATION IS NEEDED     FOR ANY PURPOSE, NOTIFY LAB     WITHIN 5 DAYS.                LOWEST DETECTABLE LIMITS     FOR URINE DRUG SCREEN     Drug Class       Cutoff (ng/mL)     Amphetamine      1000     Barbiturate      200     Benzodiazepine   200     Tricyclics       300     Opiates          300     Cocaine          300     THC              50  URINALYSIS, ROUTINE W REFLEX MICROSCOPIC     Status: Abnormal   Collection Time    03/12/13 10:32 AM      Result Value Range   Color, Urine YELLOW  YELLOW   APPearance CLOUDY (*) CLEAR   Specific Gravity, Urine 1.028  1.005 - 1.030   pH 6.0  5.0 - 8.0   Glucose, UA NEGATIVE  NEGATIVE mg/dL   Hgb urine dipstick SMALL (*) NEGATIVE   Bilirubin Urine NEGATIVE  NEGATIVE   Ketones, ur NEGATIVE  NEGATIVE mg/dL   Protein, ur 30 (*) NEGATIVE mg/dL   Urobilinogen, UA 1.0  0.0 - 1.0 mg/dL   Nitrite POSITIVE (*) NEGATIVE   Leukocytes, UA LARGE (*) NEGATIVE  URINE MICROSCOPIC-ADD ON     Status: Abnormal   Collection Time    03/12/13 10:32 AM      Result Value Range   Squamous Epithelial / LPF RARE  RARE   WBC, UA 21-50  <3 WBC/hpf   RBC / HPF 0-2  <3 RBC/hpf   Bacteria, UA FEW (*) RARE   .  Current Facility-Administered Medications  Medication Dose Route Frequency Provider Last Rate Last Dose  . nitrofurantoin (macrocrystal-monohydrate) (MACROBID) capsule 100 mg  100 mg Oral Q12H Renne Crigler, PA-C   100 mg at 03/12/13 1312   Current Outpatient Prescriptions  Medication Sig Dispense Refill  . magnesium citrate solution Take 148 mLs by mouth once.  300 mL  12  . [DISCONTINUED] loratadine (CLARITIN) 10 MG tablet Take 10 mg by mouth daily.          Psychiatric Specialty Exam:     Blood pressure 109/71, pulse 66, temperature 97.8 F (36.6 C), temperature  source Oral, resp. rate 16, last menstrual period 02/07/2013, SpO2 98.00%.There is no weight on file to calculate BMI.  General Appearance: Casual  Eye Contact::  Good  Speech:  Clear and Coherent and Normal Rate  Volume:  Normal  Mood:  Depressed  Affect:  Congruent  Thought Process:  Circumstantial, Coherent and Goal Directed  Orientation:  Full (Time, Place, and Person)  Thought Content:  Need to talk to someone  Suicidal Thoughts:  No  Homicidal Thoughts:  No  Memory:  Immediate;   Good Recent;   Good Remote;   Good  Judgement:  Good  Insight:  Present  Psychomotor Activity:  Normal  Concentration:  Good  Recall:  Good  Akathisia:  No  Handed:  Right  AIMS (if indicated):     Assets:  Communication Skills Desire for Improvement Housing Social Support Transportation  Sleep:      Face to face consult/interview with Dr. Ladona Ridgel  Treatment Plan Summary: Discharge  Disposition:  Discharge home.  Patient to follow up at [facility].  Resource information to be given for outpatient services.  Assunta Found, FNP-BC 03/12/2013 3:05 PM

## 2013-03-12 NOTE — ED Notes (Signed)
Pt was sent over from PCP for suicidal ideations with plan to wreck her car. Per triage nurse pt denies SI. States she is depressed does not have a plan.

## 2013-03-12 NOTE — ED Provider Notes (Signed)
CSN: [id number]     Arrival date & time 03/12/13  0865 History   First MD Initiated Contact with Patient 03/12/13 [phone]     Chief Complaint  Patient presents with  . Medical Clearance   (Consider location/radiation/quality/duration/timing/severity/associated sxs/prior Treatment) HPI Comments: Patient with no significant past medical presents with complaint of worsening depression for the 3 weeks with suicidal thoughts. She has thought about wrecking her car to cause harm to herself. She was sent to the emergency department today by her doctor. Currently no medical complaints except for back pain and dysuria. And onset of symptoms gradual. Course is constant. Nothing makes it better or worse. No chronic medications. Occasional EtOH use. Denies drug use.   The history is provided by the patient.    Past Medical History  Diagnosis Date  . Chronic constipation   . Infertility associated with anovulation    Past Surgical History  Procedure Laterality Date  . Implanon removed  03/2009   No family history on file. History  Substance Use Topics  . Smoking status: Never Smoker   . Smokeless tobacco: Never Used  . Alcohol Use: 0.5 oz/week    1 drink(s) per week   OB History   Grav Para Term Preterm Abortions TAB SAB Ect Mult Living                 Review of Systems  Constitutional: Negative for fever.  HENT: Negative for rhinorrhea and sore throat.   Eyes: Negative for redness.  Respiratory: Negative for cough.   Cardiovascular: Negative for chest pain.  Gastrointestinal: Negative for nausea, vomiting, abdominal pain and diarrhea.  Genitourinary: Positive for dysuria. Negative for flank pain.  Musculoskeletal: Positive for back pain. Negative for myalgias.  Skin: Negative for rash.  Neurological: Positive for headaches.    Allergies  Review of patient's allergies indicates no known allergies.  Home Medications   Current Outpatient Rx  Name  Route  Sig  Dispense  Refill  .  EXPIRED: magnesium citrate solution   Oral   Take 148 mLs by mouth once.   300 mL   12    BP 114/71  Pulse 78  Temp(Src) 98.5 F (36.9 C) (Oral)  Resp 18  SpO2 98%  LMP 02/07/2013 Physical Exam  Nursing note and vitals reviewed. Constitutional: She appears well-developed and well-nourished.  HENT:  Head: Normocephalic and atraumatic.  Eyes: Conjunctivae are normal. Right eye exhibits no discharge. Left eye exhibits no discharge.  Neck: Normal range of motion. Neck supple.  Cardiovascular: Normal rate, regular rhythm and normal heart sounds.   Pulmonary/Chest: Effort normal and breath sounds normal.  Abdominal: Soft. There is tenderness (mild) in the epigastric area and suprapubic area. There is no CVA tenderness.  Neurological: She is alert.  Skin: Skin is warm and dry.  Psychiatric: She has a normal mood and affect. Thought content is not paranoid and not delusional. She expresses suicidal ideation. She expresses no homicidal ideation. She expresses suicidal plans.    ED Course  Procedures (including critical care time) Labs Review Labs Reviewed  SALICYLATE LEVEL - Abnormal; Notable for the following:    Salicylate Lvl <2.0 (*)    All other components within normal limits  URINALYSIS, ROUTINE W REFLEX MICROSCOPIC - Abnormal; Notable for the following:    APPearance CLOUDY (*)    Hgb urine dipstick SMALL (*)    Protein, ur 30 (*)    Nitrite POSITIVE (*)    Leukocytes, UA LARGE (*)  All other components within normal limits  URINE MICROSCOPIC-ADD ON - Abnormal; Notable for the following:    Bacteria, UA FEW (*)    All other components within normal limits  URINE CULTURE  ACETAMINOPHEN LEVEL  CBC  COMPREHENSIVE METABOLIC PANEL  ETHANOL  URINE RAPID DRUG SCREEN (HOSP PERFORMED)   Imaging Review No results found.  EKG Interpretation   None      10:08 AM Patient seen and examined. Holding orders complete.   Vital signs reviewed and are as follows: Filed  Vitals:   03/12/13 0952  BP: 114/71  Pulse: 78  Temp: 98.5 F (36.9 C)  Resp: 18   11:20 AM Labs reviewed. Macrobid x 5 days written for UTI. Pending psych eval.    MDM   1. Suicidal ideation   2. Depressive disorder    Pending psych eval.    Renne Crigler, PA-C 03/12/13 1627

## 2013-03-13 ENCOUNTER — Telehealth: Payer: Self-pay | Admitting: Family Medicine

## 2013-03-13 ENCOUNTER — Other Ambulatory Visit: Payer: Self-pay | Admitting: Family Medicine

## 2013-03-13 MED ORDER — CEPHALEXIN 500 MG PO CAPS: 500.0000 mg | ORAL_CAPSULE | Freq: Two times a day (BID) | ORAL | Status: DC

## 2013-03-13 MED ORDER — SULFAMETHOXAZOLE-TMP DS 800-160 MG PO TABS: 1.0000 | ORAL_TABLET | Freq: Two times a day (BID) | ORAL | Status: DC

## 2013-03-13 NOTE — Telephone Encounter (Signed)
Spoke to patient through interpretor [employer])  She was not admitted to Inpatient psychiatry yesterday, denies current suicidal thoughts, she is being set up with outpatient therapy, told to call office immediately if her mood worsens/develops suicidal thoughts  Informed her that she has a UTI, will start Bactrim BID for 3 days

## 2013-03-13 NOTE — Telephone Encounter (Signed)
Called pharmacy to cancel Bactrim as contraindicated in women of child bearing age. Will send in Keflex.

## 2013-03-14 LAB — URINE CULTURE: Colony Count: >=100000

## 2013-03-15 ENCOUNTER — Telehealth (HOSPITAL_COMMUNITY): Payer: Self-pay | Admitting: Emergency Medicine

## 2013-03-15 NOTE — ED Notes (Signed)
Post ED Visit - Positive Culture Follow-up  Culture report reviewed by antimicrobial stewardship pharmacist: []  Wes Dulaney, Pharm.D., BCPS [x]  Celedonio Miyamoto, Pharm.D., BCPS []  Georgina Pillion, Pharm.D., BCPS []  [place], [address].D., BCPS, AAHIVP []  Estella Husk, Pharm.D., BCPS, AAHIVP  Positive urine culture Treated with Keflex, organism sensitive to the same and no further patient follow-up is required at this time.  Kylie A Holland 03/15/2013, 11:36 AM

## 2013-05-14 ENCOUNTER — Ambulatory Visit (INDEPENDENT_AMBULATORY_CARE_PROVIDER_SITE_OTHER): Payer: Medicaid Other | Admitting: Family Medicine

## 2013-05-14 ENCOUNTER — Other Ambulatory Visit (HOSPITAL_COMMUNITY)
Admission: RE | Admit: 2013-05-14 | Discharge: 2013-05-14 | Disposition: A | Discharge status: Home / Self Care | Payer: Self-pay | Source: Ambulatory Visit | Attending: Family Medicine | Admitting: Family Medicine

## 2013-05-14 ENCOUNTER — Encounter: Payer: Self-pay | Admitting: Family Medicine

## 2013-05-14 VITALS — BP 144/77 | HR 73 | Temp 99.4°F | Ht 60.75 in | Wt 129.2 lb

## 2013-05-14 DIAGNOSIS — Z113 Encounter for screening for infections with a predominantly sexual mode of transmission: Secondary | ICD-10-CM | POA: Insufficient documentation

## 2013-05-14 DIAGNOSIS — B9689 Other specified bacterial agents as the cause of diseases classified elsewhere: Secondary | ICD-10-CM | POA: Insufficient documentation

## 2013-05-14 DIAGNOSIS — R300 Dysuria: Secondary | ICD-10-CM

## 2013-05-14 DIAGNOSIS — N760 Acute vaginitis: Secondary | ICD-10-CM

## 2013-05-14 DIAGNOSIS — N390 Urinary tract infection, site not specified: Secondary | ICD-10-CM

## 2013-05-14 DIAGNOSIS — R21 Rash and other nonspecific skin eruption: Secondary | ICD-10-CM | POA: Insufficient documentation

## 2013-05-14 DIAGNOSIS — A499 Bacterial infection, unspecified: Secondary | ICD-10-CM

## 2013-05-14 DIAGNOSIS — B360 Pityriasis versicolor: Secondary | ICD-10-CM

## 2013-05-14 LAB — POCT URINALYSIS DIPSTICK
Bilirubin, UA: NEGATIVE
Glucose, UA: NEGATIVE
Ketones, UA: NEGATIVE
Nitrite, UA: NEGATIVE
PH UA: 7
PROTEIN UA: NEGATIVE
RBC UA: NEGATIVE
SPEC GRAV UA: 1.02
UROBILINOGEN UA: 0.2

## 2013-05-14 LAB — POCT WET PREP (WET MOUNT): CLUE CELLS WET PREP WHIFF POC: NEGATIVE

## 2013-05-14 LAB — POCT UA - MICROSCOPIC ONLY

## 2013-05-14 MED ORDER — METRONIDAZOLE 500 MG PO TABS: 500.0000 mg | ORAL_TABLET | Freq: Two times a day (BID) | ORAL | Status: DC

## 2013-05-14 MED ORDER — KETOCONAZOLE 2 % EX SHAM: MEDICATED_SHAMPOO | CUTANEOUS | Status: DC

## 2013-05-14 MED ORDER — CEPHALEXIN 500 MG PO CAPS: 500.0000 mg | ORAL_CAPSULE | Freq: Two times a day (BID) | ORAL | Status: DC

## 2013-05-14 NOTE — Assessment & Plan Note (Signed)
Patient presents with her redness and rash that is clinically consistent with tinea versicolor. -Patient will complete 2 week course with ketoconazole shampoo -She will also apply Eucerin cream to affected areas twice daily as dry skin may be contributing to symptoms.

## 2013-05-14 NOTE — Assessment & Plan Note (Signed)
Patient presents with vaginal discharge that is due to bacterial vaginosis. GC and Chlamydia cultures pending. -Patient will be treated with metronidazole 500 mg twice daily for 7 days.

## 2013-05-14 NOTE — Patient Instructions (Addendum)
Rash/Itching - use Ketoconazole shampoo every day for two weeks as directed, after your shower use Eucerin cream to the itchy areas   Urinary Tract Infection - will treat with Keflex  Bacterial Vaginosis - will treat with Metronidazole  Vaginosis bacteriana (Bacterial Vaginosis) La vaginosis bacteriana es una infeccin vaginal que perturba el equilibrio normal de las bacterias que se encuentran en la vagina. Es el resultado de un crecimiento excesivo de ciertas bacterias. Esta es la infeccin vaginal ms frecuente en mujeres en edad reproductiva. El tratamiento es importante para prevenir complicaciones, especialmente en mujeres embarazadas, dado que puede causar un parto prematuro. CAUSAS  La vaginosis bacteriana se origina por un aumento de bacterias nocivas que, generalmente, estn presentes en cantidades ms pequeas en la vagina. Varios tipos diferentes de bacterias pueden causar esta afeccin. Sin embargo, la causa de su desarrollo no se comprende totalmente. FACTORES DE RIESGO Ciertas actividades o comportamientos pueden exponerlo a un mayor riesgo de desarrollar vaginosis bacteriana, entre los que se incluyen:  Tener una nueva pareja sexual o mltiples parejas sexuales.  Las duchas vaginales  El uso del DIU (dispositivo intrauterino) como mtodo anticonceptivo. El contagio no se produce en baos, por ropas de cama, en piscinas o por contacto con objetos. SIGNOS Y SNTOMAS  Algunas mujeres que padecen vaginosis bacteriana no presentan signos ni sntomas. Los sntomas ms comunes son:  Secrecin vaginal de color grisceo.  Secrecin vaginal con olor similar al Wal-Martpescado, especialmente despus de Sales promotion account executivemantener relaciones sexuales.  Picazn o sensacin de ardor en la vagina o la vulva.  Ardor o dolor al ConocoPhillipsorinar. DIAGNSTICO  Su mdico analizar su historia clnica y le examinar la vagina para detectar signos de vaginosis bacteriana. Puede tomarle Lauris Poaguna muestra de flujo vaginal. Su mdico  examinar esta muestra con un microscopio para controlar las bacterias y clulas anormales. Tambin puede realizarse un anlisis del pH vaginal.  TRATAMIENTO  La vaginosis bacteriana puede tratarse con antibiticos, en forma de comprimidos o de crema vaginal. Puede indicarse una segunda tanda de antibiticos si la afeccin se repite despus del tratamiento.  INSTRUCCIONES PARA EL CUIDADO EN EL HOGAR   Tome solo medicamentos de venta libre o recetados, segn las indicaciones del mdico.  Si le han recetado antibiticos, tmelos como se le indic. Asegrese de que finaliza la prescripcin completa aunque se sienta mejor.  No mantenga relaciones sexuales Librarian, academichasta completar el tratamiento.  Comunique a sus compaeros sexuales que sufre una infeccin vaginal. Deben consultar a su mdico y recibir tratamiento si tienen problemas, como picazn o una erupcin cutnea leve.  Practique el sexo seguro usando preservativos y tenga un nico compaero sexual. SOLICITE ATENCIN MDICA SI:   Sus sntomas no mejoran despus de 3 das de Spring Hilltratamiento.  Aumenta la secrecin o Chief Technology Officerel dolor.  Tiene fiebre. ASEGRESE DE QUE:   Comprende estas instrucciones.  Controlar su afeccin.  Recibir ayuda de inmediato si no mejora o si empeora. PARA OBTENER MS INFORMACIN  Centros para el control y la prevencin de Child psychotherapistenfermedades (Centers for Disease Control and Prevention, CDC): SolutionApps.co.zawww.cdc.gov/std Asociacin Estadounidense de la Salud Sexual (American Sexual Health Association, SHA): www.ashastd.org  Document Released: 07/05/2007 Document Revised: 01/16/2013 [facility] Patient Information 2014 [place]ExitCare, [place].

## 2013-05-14 NOTE — Addendum Note (Signed)
Addended by: Jennette BillBUSICK, Logann Whitebread L on: 05/14/2013 03:56 PM   Modules accepted: Orders

## 2013-05-14 NOTE — Assessment & Plan Note (Signed)
Patient has evidence of urinary tract infection based on urine dipstick and microscopy. Urine to be sent for culture. -Will start treatment with Keflex 500 mg twice a day for 3 days

## 2013-05-14 NOTE — Progress Notes (Signed)
   Subjective:    Patient ID: Traci Porter, female    DOB: 1990/12/27, 23 y.o.   MRN: [id number]  HPI 23 year old female presents for evaluation of multiple medical complaints.  Itching-patient reports pruritus over the past month, it started over her right breast and spread to her trunk, she feels that her skin is slightly dry, no associated rash  Vaginal discharge-has been present for the past 2 weeks, has associated vaginal irritation, denies dysuria however does note bowel small of her urine, denies new sexual partners, sexually active with her boyfriend, denies a history of sexual transmitted illnesses, no abdominal pain, no fevers or chills  Review of Systems  Constitutional: Negative for fever, chills and fatigue.  Respiratory: Negative for cough, chest tightness and shortness of breath.   Cardiovascular: Negative for chest pain and leg swelling.  Genitourinary: Positive for vaginal discharge. Negative for dysuria, urgency and enuresis.  Skin: Positive for rash.       Objective:   Physical Exam Vitals: Reviewed General: Pleasant Hispanic female, no acute distress, interpreter present HEENT: Normocephalic, pupils equal round and reactive to light, no scleral icterus, moist mucous membranes, no evidence of jaundice under the tongue, neck was supple next a cardiac: Regular in rhythm, S1-S2 present, no murmurs, no heaves or thrills Respiratory: Clear to patient bilaterally, normal effort Abdomen: Soft, nontender GU: Done in the presence of nurse, normal external female genitalia, speculum examination identified mild amount of white discharge, cervix was visualized with large transition zone in the 12:00 position, cultures were obtained, bimanual examination identified slightly tender cervix, no abnormal pelvic masses were identified Skin: Multiple hyperpigmented areas over the bilateral breast and upper chest with overlying excoriations  Reviewed urine stick and microscopy performed  today and lab. Wet mount consistent with bacterial vaginosis     Assessment & Plan:  Please see problem specific assessment and plan.

## 2013-05-15 ENCOUNTER — Encounter: Payer: Self-pay | Admitting: Family Medicine

## 2013-05-15 LAB — URINE CULTURE
Colony Count: NO GROWTH
Organism ID, Bacteria: NO GROWTH

## 2013-05-22 ENCOUNTER — Telehealth: Payer: Self-pay | Admitting: Family Medicine

## 2013-05-22 NOTE — Telephone Encounter (Signed)
Will fwd to MD.  Traci Porter, CMA  

## 2013-05-22 NOTE — Telephone Encounter (Signed)
Pt called to let PCP know that ketoconazole (NIZORAL) 2 % shampoo make symptoms worse. Pt stop medication and will like to know What she can do ?   [place]

## 2013-05-23 NOTE — Telephone Encounter (Signed)
Patient stopped Nizoral, symptoms improved, still having intermittent itching of legs however improved in Eucerin. Patient to continue Eucerin and return to office if needed.

## 2013-08-02 ENCOUNTER — Ambulatory Visit: Payer: Self-pay | Admitting: Family Medicine

## 2013-08-06 ENCOUNTER — Ambulatory Visit (INDEPENDENT_AMBULATORY_CARE_PROVIDER_SITE_OTHER): Payer: Self-pay | Admitting: Family Medicine

## 2013-08-06 ENCOUNTER — Encounter: Payer: Self-pay | Admitting: Family Medicine

## 2013-08-06 ENCOUNTER — Other Ambulatory Visit (HOSPITAL_COMMUNITY)
Admission: RE | Admit: 2013-08-06 | Discharge: 2013-08-06 | Disposition: A | Discharge status: Home / Self Care | Payer: Self-pay | Source: Ambulatory Visit | Attending: Family Medicine | Admitting: Family Medicine

## 2013-08-06 VITALS — BP 107/71 | HR 76 | Temp 98.5°F | Wt 123.0 lb

## 2013-08-06 DIAGNOSIS — N898 Other specified noninflammatory disorders of vagina: Secondary | ICD-10-CM

## 2013-08-06 DIAGNOSIS — Z113 Encounter for screening for infections with a predominantly sexual mode of transmission: Secondary | ICD-10-CM | POA: Insufficient documentation

## 2013-08-06 DIAGNOSIS — R300 Dysuria: Secondary | ICD-10-CM

## 2013-08-06 DIAGNOSIS — N760 Acute vaginitis: Secondary | ICD-10-CM

## 2013-08-06 LAB — POCT WET PREP (WET MOUNT)
Clue Cells Wet Prep Whiff POC: NEGATIVE
WBC, Wet Prep HPF POC: >20

## 2013-08-06 LAB — POCT URINALYSIS DIPSTICK
Bilirubin, UA: NEGATIVE
Glucose, UA: NEGATIVE
Ketones, UA: NEGATIVE
Leukocytes, UA: NEGATIVE
NITRITE UA: NEGATIVE
PH UA: 7
PROTEIN UA: NEGATIVE
RBC UA: NEGATIVE
Spec Grav, UA: 1.02
Urobilinogen, UA: 0.2

## 2013-08-06 LAB — RPR

## 2013-08-06 NOTE — Patient Instructions (Signed)
Dr. Randolm IdolFletke will call you with you lab results in the next 1-2 days.

## 2013-08-06 NOTE — Progress Notes (Signed)
   Subjective:    Patient ID: Traci BoysDora Porter, female    DOB: Aug 24, 1990, 23 y.o.   MRN: [id number]  HPI 23 y/o female presents for evaluation of vaginal discharge, has been present for 3 weeks, associated dysuria and suprapubic abdominal pain, no fevers, no back pain, no nausea or emesis, no changes in bowel habits, she has a history of BV, no history of STD's, she is currently married, denies other sexual partners, LMP 08/01/13   Review of Systems  Constitutional: Negative for fever, chills and fatigue.  Gastrointestinal: Positive for abdominal pain. Negative for nausea, vomiting and diarrhea.  Genitourinary: Positive for dysuria and vaginal discharge.       Objective:   Physical Exam Vitals: reviewed Gen: pleasant female, NAD Cardiac: RRR, S1 and S2 normal, no murmurs, no heaves/thrills Resp: CTAB, normal effort GU: nursing staff present, normal external female anatomy, speculum exam - normal appearing cervix with yellow mucous/discharge at the external os, cultures obtained, mild cervical tender on bimanual exam, no gross pelvic mass  PCO urine dipstick and wet prep unremarkable.      Assessment & Plan:  Please see problem specific assessment and plan.

## 2013-08-07 ENCOUNTER — Telehealth: Payer: Self-pay | Admitting: Family Medicine

## 2013-08-07 LAB — CERVICOVAGINAL ANCILLARY ONLY
CHLAMYDIA, DNA PROBE: NEGATIVE
Neisseria Gonorrhea: NEGATIVE

## 2013-08-07 LAB — HEPATITIS PANEL, ACUTE
HCV Ab: NEGATIVE
HEP B S AG: NEGATIVE
Hep A IgM: NONREACTIVE
Hep B C IgM: NONREACTIVE

## 2013-08-07 LAB — HIV ANTIBODY (ROUTINE TESTING W REFLEX): HIV 1&2 Ab, 4th Generation: NONREACTIVE

## 2013-08-07 NOTE — Assessment & Plan Note (Signed)
Patient presents with dysuria for 3 weeks associated with vaginal discharge -PCO urine dipstick negative -Wet prep negative - vaginal cultures pending -Will call patient with results

## 2013-08-07 NOTE — Assessment & Plan Note (Addendum)
Patient presents for evaluation of vaginal discharge.  -Wet prep negative -GC/Chlam cultures sent -HIV/RPR/Acute Hepatitis Panel drawn today as well -Will call patient with results

## 2013-08-07 NOTE — Telephone Encounter (Signed)
Discussed negative HIV/RPR/Hepatitis/Wet prep/Urine, will call patient with gonorrhea/chlamydia results

## 2013-08-09 ENCOUNTER — Encounter: Payer: Self-pay | Admitting: Family Medicine

## 2013-08-20 ENCOUNTER — Ambulatory Visit: Payer: Self-pay

## 2013-08-21 ENCOUNTER — Ambulatory Visit: Payer: Self-pay

## 2014-01-20 ENCOUNTER — Telehealth: Payer: Self-pay | Admitting: Family Medicine

## 2014-01-20 ENCOUNTER — Encounter: Payer: Self-pay | Admitting: Family Medicine

## 2014-01-20 ENCOUNTER — Ambulatory Visit (INDEPENDENT_AMBULATORY_CARE_PROVIDER_SITE_OTHER): Payer: Self-pay | Admitting: Family Medicine

## 2014-01-20 ENCOUNTER — Other Ambulatory Visit (HOSPITAL_COMMUNITY)
Admission: RE | Admit: 2014-01-20 | Discharge: 2014-01-20 | Disposition: A | Discharge status: Home / Self Care | Payer: Self-pay | Source: Ambulatory Visit | Attending: Family Medicine | Admitting: Family Medicine

## 2014-01-20 VITALS — BP 110/63 | HR 61 | Temp 98.8°F | Ht 60.0 in | Wt 122.9 lb

## 2014-01-20 DIAGNOSIS — B9689 Other specified bacterial agents as the cause of diseases classified elsewhere: Secondary | ICD-10-CM

## 2014-01-20 DIAGNOSIS — Z0000 Encounter for general adult medical examination without abnormal findings: Secondary | ICD-10-CM

## 2014-01-20 DIAGNOSIS — N760 Acute vaginitis: Principal | ICD-10-CM

## 2014-01-20 DIAGNOSIS — N898 Other specified noninflammatory disorders of vagina: Secondary | ICD-10-CM

## 2014-01-20 DIAGNOSIS — Z23 Encounter for immunization: Secondary | ICD-10-CM

## 2014-01-20 DIAGNOSIS — Z113 Encounter for screening for infections with a predominantly sexual mode of transmission: Secondary | ICD-10-CM | POA: Insufficient documentation

## 2014-01-20 LAB — POCT WET PREP (WET MOUNT): Clue Cells Wet Prep Whiff POC: POSITIVE

## 2014-01-20 MED ORDER — METRONIDAZOLE 500 MG PO TABS: 500.0000 mg | ORAL_TABLET | Freq: Two times a day (BID) | ORAL | Status: DC

## 2014-01-20 NOTE — Patient Instructions (Signed)
It was nice to see you today.  Shots - you are due for a flu shot today  Diet - I recommend that you increase the number of fruits and vegetables that you eat daily, attempt to eat 2-3 servings of dairy per day  Exercise - please attempt to exercise 2-3 times per week  You will be given information on healthcare power of attorney and living wills today  Dr. Ree Kida will call you with your results  You are due for your next pap smear in January 2017  Preventive Care for Adults A healthy lifestyle and preventive care can promote health and wellness. Preventive health guidelines for women include the following key practices.  A routine yearly physical is a good way to check with your health care provider about your health and preventive screening. It is a chance to share any concerns and updates on your health and to receive a thorough exam.  Visit your dentist for a routine exam and preventive care every 6 months. Brush your teeth twice a day and floss once a day. Good oral hygiene prevents tooth decay and gum disease.  The frequency of eye exams is based on your age, health, family medical history, use of contact lenses, and other factors. Follow your health care provider's recommendations for frequency of eye exams.  Eat a healthy diet. Foods like vegetables, fruits, whole grains, low-fat dairy products, and lean protein foods contain the nutrients you need without too many calories. Decrease your intake of foods high in solid fats, added sugars, and salt. Eat the right amount of calories for you.Get information about a proper diet from your health care provider, if necessary.  Regular physical exercise is one of the most important things you can do for your health. Most adults should get at least 150 minutes of moderate-intensity exercise (any activity that increases your heart rate and causes you to sweat) each week. In addition, most adults need muscle-strengthening exercises on 2 or more  days a week.  Maintain a healthy weight. The body mass index (BMI) is a screening tool to identify possible weight problems. It provides an estimate of body fat based on height and weight. Your health care provider can find your BMI and can help you achieve or maintain a healthy weight.For adults 20 years and older:  A BMI below 18.5 is considered underweight.  A BMI of 18.5 to 24.9 is normal.  A BMI of 25 to 29.9 is considered overweight.  A BMI of 30 and above is considered obese.  Maintain normal blood lipids and cholesterol levels by exercising and minimizing your intake of saturated fat. Eat a balanced diet with plenty of fruit and vegetables. Blood tests for lipids and cholesterol should begin at age 52 and be repeated every 5 years. If your lipid or cholesterol levels are high, you are over 50, or you are at high risk for heart disease, you may need your cholesterol levels checked more frequently.Ongoing high lipid and cholesterol levels should be treated with medicines if diet and exercise are not working.  If you smoke, find out from your health care provider how to quit. If you do not use tobacco, do not start.  Lung cancer screening is recommended for adults aged 34-80 years who are at high risk for developing lung cancer because of a history of smoking. A yearly low-dose CT scan of the lungs is recommended for people who have at least a 30-pack-year history of smoking and are a current smoker  or have quit within the past 15 years. A pack year of smoking is smoking an average of 1 pack of cigarettes a day for 1 year (for example: 1 pack a day for 30 years or 2 packs a day for 15 years). Yearly screening should continue until the smoker has stopped smoking for at least 15 years. Yearly screening should be stopped for people who develop a health problem that would prevent them from having lung cancer treatment.  If you are pregnant, do not drink alcohol. If you are breastfeeding, be very  cautious about drinking alcohol. If you are not pregnant and choose to drink alcohol, do not have more than 1 drink per day. One drink is considered to be 12 ounces (355 mL) of beer, 5 ounces (148 mL) of wine, or 1.5 ounces (44 mL) of liquor.  Avoid use of street drugs. Do not share needles with anyone. Ask for help if you need support or instructions about stopping the use of drugs.  High blood pressure causes heart disease and increases the risk of stroke. Your blood pressure should be checked at least every 1 to 2 years. Ongoing high blood pressure should be treated with medicines if weight loss and exercise do not work.  If you are 5-73 years old, ask your health care provider if you should take aspirin to prevent strokes.  Diabetes screening involves taking a blood sample to check your fasting blood sugar level. This should be done once every 3 years, after age 94, if you are within normal weight and without risk factors for diabetes. Testing should be considered at a younger age or be carried out more frequently if you are overweight and have at least 1 risk factor for diabetes.  Breast cancer screening is essential preventive care for women. You should practice "breast self-awareness." This means understanding the normal appearance and feel of your breasts and may include breast self-examination. Any changes detected, no matter how small, should be reported to a health care provider. Women in their 58s and 30s should have a clinical breast exam (CBE) by a health care provider as part of a regular health exam every 1 to 3 years. After age 28, women should have a CBE every year. Starting at age 62, women should consider having a mammogram (breast X-ray test) every year. Women who have a family history of breast cancer should talk to their health care provider about genetic screening. Women at a high risk of breast cancer should talk to their health care providers about having an MRI and a mammogram  every year.  Breast cancer gene (BRCA)-related cancer risk assessment is recommended for women who have family members with BRCA-related cancers. BRCA-related cancers include breast, ovarian, tubal, and peritoneal cancers. Having family members with these cancers may be associated with an increased risk for harmful changes (mutations) in the breast cancer genes BRCA1 and BRCA2. Results of the assessment will determine the need for genetic counseling and BRCA1 and BRCA2 testing.  Routine pelvic exams to screen for cancer are no longer recommended for nonpregnant women who are considered low risk for cancer of the pelvic organs (ovaries, uterus, and vagina) and who do not have symptoms. Ask your health care provider if a screening pelvic exam is right for you.  If you have had past treatment for cervical cancer or a condition that could lead to cancer, you need Pap tests and screening for cancer for at least 20 years after your treatment. If Pap tests  have been discontinued, your risk factors (such as having a new sexual partner) need to be reassessed to determine if screening should be resumed. Some women have medical problems that increase the chance of getting cervical cancer. In these cases, your health care provider may recommend more frequent screening and Pap tests.  The HPV test is an additional test that may be used for cervical cancer screening. The HPV test looks for the virus that can cause the cell changes on the cervix. The cells collected during the Pap test can be tested for HPV. The HPV test could be used to screen women aged 6 years and older, and should be used in women of any age who have unclear Pap test results. After the age of 62, women should have HPV testing at the same frequency as a Pap test.  Colorectal cancer can be detected and often prevented. Most routine colorectal cancer screening begins at the age of 60 years and continues through age 8 years. However, your health care  provider may recommend screening at an earlier age if you have risk factors for colon cancer. On a yearly basis, your health care provider may provide home test kits to check for hidden blood in the stool. Use of a small camera at the end of a tube, to directly examine the colon (sigmoidoscopy or colonoscopy), can detect the earliest forms of colorectal cancer. Talk to your health care provider about this at age 20, when routine screening begins. Direct exam of the colon should be repeated every 5-10 years through age 52 years, unless early forms of pre-cancerous polyps or small growths are found.  People who are at an increased risk for hepatitis B should be screened for this virus. You are considered at high risk for hepatitis B if:  You were born in a country where hepatitis B occurs often. Talk with your health care provider about which countries are considered high risk.  Your parents were born in a high-risk country and you have not received a shot to protect against hepatitis B (hepatitis B vaccine).  You have HIV or AIDS.  You use needles to inject street drugs.  You live with, or have sex with, someone who has hepatitis B.  You get hemodialysis treatment.  You take certain medicines for conditions like cancer, organ transplantation, and autoimmune conditions.  Hepatitis C blood testing is recommended for all people born from [birth year] through 1965 and any individual with known risks for hepatitis C.  Practice safe sex. Use condoms and avoid high-risk sexual practices to reduce the spread of sexually transmitted infections (STIs). STIs include gonorrhea, chlamydia, syphilis, trichomonas, herpes, HPV, and human immunodeficiency virus (HIV). Herpes, HIV, and HPV are viral illnesses that have no cure. They can result in disability, cancer, and death.  You should be screened for sexually transmitted illnesses (STIs) including gonorrhea and chlamydia if:  You are sexually active and are  younger than 24 years.  You are older than 24 years and your health care provider tells you that you are at risk for this type of infection.  Your sexual activity has changed since you were last screened and you are at an increased risk for chlamydia or gonorrhea. Ask your health care provider if you are at risk.  If you are at risk of being infected with HIV, it is recommended that you take a prescription medicine daily to prevent HIV infection. This is called preexposure prophylaxis (PrEP). You are considered at risk if:  You  are a heterosexual woman, are sexually active, and are at increased risk for HIV infection.  You take drugs by injection.  You are sexually active with a partner who has HIV.  Talk with your health care provider about whether you are at high risk of being infected with HIV. If you choose to begin PrEP, you should first be tested for HIV. You should then be tested every 3 months for as long as you are taking PrEP.  Osteoporosis is a disease in which the bones lose minerals and strength with aging. This can result in serious bone fractures or breaks. The risk of osteoporosis can be identified using a bone density scan. Women ages 42 years and over and women at risk for fractures or osteoporosis should discuss screening with their health care providers. Ask your health care provider whether you should take a calcium supplement or vitamin D to reduce the rate of osteoporosis.  Menopause can be associated with physical symptoms and risks. Hormone replacement therapy is available to decrease symptoms and risks. You should talk to your health care provider about whether hormone replacement therapy is right for you.  Use sunscreen. Apply sunscreen liberally and repeatedly throughout the day. You should seek shade when your shadow is shorter than you. Protect yourself by wearing long sleeves, pants, a wide-brimmed hat, and sunglasses year round, whenever you are outdoors.  Once a  month, do a whole body skin exam, using a mirror to look at the skin on your back. Tell your health care provider of new moles, moles that have irregular borders, moles that are larger than a pencil eraser, or moles that have changed in shape or color.  Stay current with required vaccines (immunizations).  Influenza vaccine. All adults should be immunized every year.  Tetanus, diphtheria, and acellular pertussis (Td, Tdap) vaccine. Pregnant women should receive 1 dose of Tdap vaccine during each pregnancy. The dose should be obtained regardless of the length of time since the last dose. Immunization is preferred during the 27th-36th week of gestation. An adult who has not previously received Tdap or who does not know her vaccine status should receive 1 dose of Tdap. This initial dose should be followed by tetanus and diphtheria toxoids (Td) booster doses every 10 years. Adults with an unknown or incomplete history of completing a 3-dose immunization series with Td-containing vaccines should begin or complete a primary immunization series including a Tdap dose. Adults should receive a Td booster every 10 years.  Varicella vaccine. An adult without evidence of immunity to varicella should receive 2 doses or a second dose if she has previously received 1 dose. Pregnant females who do not have evidence of immunity should receive the first dose after pregnancy. This first dose should be obtained before leaving the health care facility. The second dose should be obtained 4-8 weeks after the first dose.  Human papillomavirus (HPV) vaccine. Females aged 13-26 years who have not received the vaccine previously should obtain the 3-dose series. The vaccine is not recommended for use in pregnant females. However, pregnancy testing is not needed before receiving a dose. If a female is found to be pregnant after receiving a dose, no treatment is needed. In that case, the remaining doses should be delayed until after the  pregnancy. Immunization is recommended for any person with an immunocompromised condition through the age of 106 years if she did not get any or all doses earlier. During the 3-dose series, the second dose should be obtained 4-8  weeks after the first dose. The third dose should be obtained 24 weeks after the first dose and 16 weeks after the second dose.  Zoster vaccine. One dose is recommended for adults aged 57 years or older unless certain conditions are present.  Measles, mumps, and rubella (MMR) vaccine. Adults born before [birth year] generally are considered immune to measles and mumps. Adults born in [birth year] or later should have 1 or more doses of MMR vaccine unless there is a contraindication to the vaccine or there is laboratory evidence of immunity to each of the three diseases. A routine second dose of MMR vaccine should be obtained at least 28 days after the first dose for students attending postsecondary schools, health care workers, or international travelers. People who received inactivated measles vaccine or an unknown type of measles vaccine during 1963-1967 should receive 2 doses of MMR vaccine. People who received inactivated mumps vaccine or an unknown type of mumps vaccine before 1979 and are at high risk for mumps infection should consider immunization with 2 doses of MMR vaccine. For females of childbearing age, rubella immunity should be determined. If there is no evidence of immunity, females who are not pregnant should be vaccinated. If there is no evidence of immunity, females who are pregnant should delay immunization until after pregnancy. Unvaccinated health care workers born before [birth year] who lack laboratory evidence of measles, mumps, or rubella immunity or laboratory confirmation of disease should consider measles and mumps immunization with 2 doses of MMR vaccine or rubella immunization with 1 dose of MMR vaccine.  Pneumococcal 13-valent conjugate (PCV13) vaccine. When indicated, a person  who is uncertain of her immunization history and has no record of immunization should receive the PCV13 vaccine. An adult aged 36 years or older who has certain medical conditions and has not been previously immunized should receive 1 dose of PCV13 vaccine. This PCV13 should be followed with a dose of pneumococcal polysaccharide (PPSV23) vaccine. The PPSV23 vaccine dose should be obtained at least 8 weeks after the dose of PCV13 vaccine. An adult aged 12 years or older who has certain medical conditions and previously received 1 or more doses of PPSV23 vaccine should receive 1 dose of PCV13. The PCV13 vaccine dose should be obtained 1 or more years after the last PPSV23 vaccine dose.  Pneumococcal polysaccharide (PPSV23) vaccine. When PCV13 is also indicated, PCV13 should be obtained first. All adults aged 42 years and older should be immunized. An adult younger than age 87 years who has certain medical conditions should be immunized. Any person who resides in a nursing home or long-term care facility should be immunized. An adult smoker should be immunized. People with an immunocompromised condition and certain other conditions should receive both PCV13 and PPSV23 vaccines. People with human immunodeficiency virus (HIV) infection should be immunized as soon as possible after diagnosis. Immunization during chemotherapy or radiation therapy should be avoided. Routine use of PPSV23 vaccine is not recommended for American Indians, [place] Natives, or people younger than 65 years unless there are medical conditions that require PPSV23 vaccine. When indicated, people who have unknown immunization and have no record of immunization should receive PPSV23 vaccine. One-time revaccination 5 years after the first dose of PPSV23 is recommended for people aged 19-64 years who have chronic kidney failure, nephrotic syndrome, asplenia, or immunocompromised conditions. People who received 1-2 doses of PPSV23 before age 64 years  should receive another dose of PPSV23 vaccine at age 47 years or later if at least 5 years  have passed since the previous dose. Doses of PPSV23 are not needed for people immunized with PPSV23 at or after age 59 years.  Meningococcal vaccine. Adults with asplenia or persistent complement component deficiencies should receive 2 doses of quadrivalent meningococcal conjugate (MenACWY-D) vaccine. The doses should be obtained at least 2 months apart. Microbiologists working with certain meningococcal bacteria, [place] recruits, people at risk during an outbreak, and people who travel to or live in countries with a high rate of meningitis should be immunized. A first-year college student up through age 68 years who is living in a residence hall should receive a dose if she did not receive a dose on or after her 16th birthday. Adults who have certain high-risk conditions should receive one or more doses of vaccine.  Hepatitis A vaccine. Adults who wish to be protected from this disease, have certain high-risk conditions, work with hepatitis A-infected animals, work in hepatitis A research labs, or travel to or work in countries with a high rate of hepatitis A should be immunized. Adults who were previously unvaccinated and who anticipate close contact with an international adoptee during the first 60 days after arrival in the [place] from a country with a high rate of hepatitis A should be immunized.  Hepatitis B vaccine. Adults who wish to be protected from this disease, have certain high-risk conditions, may be exposed to blood or other infectious body fluids, are household contacts or sex partners of hepatitis B positive people, are clients or workers in certain care facilities, or travel to or work in countries with a high rate of hepatitis B should be immunized.  Haemophilus influenzae type b (Hib) vaccine. A previously unvaccinated person with asplenia or sickle cell disease or having a scheduled  splenectomy should receive 1 dose of Hib vaccine. Regardless of previous immunization, a recipient of a hematopoietic stem cell transplant should receive a 3-dose series 6-12 months after her successful transplant. Hib vaccine is not recommended for adults with HIV infection. Preventive Services / Frequency Ages 23 to 66 years  Blood pressure check.** / Every 1 to 2 years.  Lipid and cholesterol check.** / Every 5 years beginning at age 72.  Clinical breast exam.** / Every 3 years for women in their 7s and 82s.  BRCA-related cancer risk assessment.** / For women who have family members with a BRCA-related cancer (breast, ovarian, tubal, or peritoneal cancers).  Pap test.** / Every 2 years from ages 28 through 44. Every 3 years starting at age 56 through age 63 or 60 with a history of 3 consecutive normal Pap tests.  HPV screening.** / Every 3 years from ages 34 through ages 47 to 29 with a history of 3 consecutive normal Pap tests.  Hepatitis C blood test.** / For any individual with known risks for hepatitis C.  Skin self-exam. / Monthly.  Influenza vaccine. / Every year.  Tetanus, diphtheria, and acellular pertussis (Tdap, Td) vaccine.** / Consult your health care provider. Pregnant women should receive 1 dose of Tdap vaccine during each pregnancy. 1 dose of Td every 10 years.  Varicella vaccine.** / Consult your health care provider. Pregnant females who do not have evidence of immunity should receive the first dose after pregnancy.  HPV vaccine. / 3 doses over 6 months, if 31 and younger. The vaccine is not recommended for use in pregnant females. However, pregnancy testing is not needed before receiving a dose.  Measles, mumps, rubella (MMR) vaccine.** / You need at least 1 dose of  MMR if you were born in 1957 or later. You may also need a 2nd dose. For females of childbearing age, rubella immunity should be determined. If there is no evidence of immunity, females who are not  pregnant should be vaccinated. If there is no evidence of immunity, females who are pregnant should delay immunization until after pregnancy.  Pneumococcal 13-valent conjugate (PCV13) vaccine.** / Consult your health care provider.  Pneumococcal polysaccharide (PPSV23) vaccine.** / 1 to 2 doses if you smoke cigarettes or if you have certain conditions.  Meningococcal vaccine.** / 1 dose if you are age 76 to 40 years and a Market researcher living in a residence hall, or have one of several medical conditions, you need to get vaccinated against meningococcal disease. You may also need additional booster doses.  Hepatitis A vaccine.** / Consult your health care provider.  Hepatitis B vaccine.** / Consult your health care provider.  Haemophilus influenzae type b (Hib) vaccine.** / Consult your health care provider. Ages 63 to 39 years  Blood pressure check.** / Every 1 to 2 years.  Lipid and cholesterol check.** / Every 5 years beginning at age 62 years.  Lung cancer screening. / Every year if you are aged 71-80 years and have a 30-pack-year history of smoking and currently smoke or have quit within the past 15 years. Yearly screening is stopped once you have quit smoking for at least 15 years or develop a health problem that would prevent you from having lung cancer treatment.  Clinical breast exam.** / Every year after age 32 years.  BRCA-related cancer risk assessment.** / For women who have family members with a BRCA-related cancer (breast, ovarian, tubal, or peritoneal cancers).  Mammogram.** / Every year beginning at age 42 years and continuing for as long as you are in good health. Consult with your health care provider.  Pap test.** / Every 3 years starting at age 51 years through age 43 or 45 years with a history of 3 consecutive normal Pap tests.  HPV screening.** / Every 3 years from ages 74 years through ages 4 to 32 years with a history of 3 consecutive normal Pap  tests.  Fecal occult blood test (FOBT) of stool. / Every year beginning at age 50 years and continuing until age 51 years. You may not need to do this test if you get a colonoscopy every 10 years.  Flexible sigmoidoscopy or colonoscopy.** / Every 5 years for a flexible sigmoidoscopy or every 10 years for a colonoscopy beginning at age 63 years and continuing until age 40 years.  Hepatitis C blood test.** / For all people born from [birth year] through 1967 and any individual with known risks for hepatitis C.  Skin self-exam. / Monthly.  Influenza vaccine. / Every year.  Tetanus, diphtheria, and acellular pertussis (Tdap/Td) vaccine.** / Consult your health care provider. Pregnant women should receive 1 dose of Tdap vaccine during each pregnancy. 1 dose of Td every 10 years.  Varicella vaccine.** / Consult your health care provider. Pregnant females who do not have evidence of immunity should receive the first dose after pregnancy.  Zoster vaccine.** / 1 dose for adults aged 46 years or older.  Measles, mumps, rubella (MMR) vaccine.** / You need at least 1 dose of MMR if you were born in 1957 or later. You may also need a 2nd dose. For females of childbearing age, rubella immunity should be determined. If there is no evidence of immunity, females who are not pregnant should be  vaccinated. If there is no evidence of immunity, females who are pregnant should delay immunization until after pregnancy.  Pneumococcal 13-valent conjugate (PCV13) vaccine.** / Consult your health care provider.  Pneumococcal polysaccharide (PPSV23) vaccine.** / 1 to 2 doses if you smoke cigarettes or if you have certain conditions.  Meningococcal vaccine.** / Consult your health care provider.  Hepatitis A vaccine.** / Consult your health care provider.  Hepatitis B vaccine.** / Consult your health care provider.  Haemophilus influenzae type b (Hib) vaccine.** / Consult your health care provider. Ages 74 years and  over  Blood pressure check.** / Every 1 to 2 years.  Lipid and cholesterol check.** / Every 5 years beginning at age 60 years.  Lung cancer screening. / Every year if you are aged 21-80 years and have a 30-pack-year history of smoking and currently smoke or have quit within the past 15 years. Yearly screening is stopped once you have quit smoking for at least 15 years or develop a health problem that would prevent you from having lung cancer treatment.  Clinical breast exam.** / Every year after age 59 years.  BRCA-related cancer risk assessment.** / For women who have family members with a BRCA-related cancer (breast, ovarian, tubal, or peritoneal cancers).  Mammogram.** / Every year beginning at age 74 years and continuing for as long as you are in good health. Consult with your health care provider.  Pap test.** / Every 3 years starting at age 72 years through age 35 or 46 years with 3 consecutive normal Pap tests. Testing can be stopped between 65 and 70 years with 3 consecutive normal Pap tests and no abnormal Pap or HPV tests in the past 10 years.  HPV screening.** / Every 3 years from ages 33 years through ages 98 or 102 years with a history of 3 consecutive normal Pap tests. Testing can be stopped between 65 and 70 years with 3 consecutive normal Pap tests and no abnormal Pap or HPV tests in the past 10 years.  Fecal occult blood test (FOBT) of stool. / Every year beginning at age 40 years and continuing until age 18 years. You may not need to do this test if you get a colonoscopy every 10 years.  Flexible sigmoidoscopy or colonoscopy.** / Every 5 years for a flexible sigmoidoscopy or every 10 years for a colonoscopy beginning at age 56 years and continuing until age 69 years.  Hepatitis C blood test.** / For all people born from [birth year] through 1967 and any individual with known risks for hepatitis C.  Osteoporosis screening.** / A one-time screening for women ages 62 years and over and  women at risk for fractures or osteoporosis.  Skin self-exam. / Monthly.  Influenza vaccine. / Every year.  Tetanus, diphtheria, and acellular pertussis (Tdap/Td) vaccine.** / 1 dose of Td every 10 years.  Varicella vaccine.** / Consult your health care provider.  Zoster vaccine.** / 1 dose for adults aged 96 years or older.  Pneumococcal 13-valent conjugate (PCV13) vaccine.** / Consult your health care provider.  Pneumococcal polysaccharide (PPSV23) vaccine.** / 1 dose for all adults aged 58 years and older.  Meningococcal vaccine.** / Consult your health care provider.  Hepatitis A vaccine.** / Consult your health care provider.  Hepatitis B vaccine.** / Consult your health care provider.  Haemophilus influenzae type b (Hib) vaccine.** / Consult your health care provider. ** Family history and personal history of risk and conditions may change your health care provider's recommendations. Document Released: 05/24/2001 Document  Revised: 08/12/2013 Document Reviewed: 08/23/2010 ExitCare Patient Information 2015 [place], [place]. This information is not intended to replace advice given to you by your health care provider. Make sure you discuss any questions you have with your health care provider.

## 2014-01-20 NOTE — Progress Notes (Signed)
Patient ID: Traci Porter, female   DOB: 10-Jul-1990, 23 y.o.   MRN: [id number] 23 y.o. year old female presents for well woman/preventative visit and annual GYN examination.  Acute Concerns: vaginal discharge/irritation for one week, no dysuria, mild bilateral lower quadrant pain  Diet: few fruits and vegetables, 1-2 dairy products per day, chicken/rice are staples  Exercise: No regular exercise  Sexual/Birth History: one son, currently sexually active with one female partner (for 6 years), no condoms, currently on menses, history of irregular menses  Birth Control: None  POA/Living Will: None  Social:  History   Social History  . Marital Status: Single    Spouse Name: Traci Porter    Number of Children: 1  . Years of Education: 5   Social History Main Topics  . Smoking status: Never Smoker   . Smokeless tobacco: Never Used  . Alcohol Use: 0.5 oz/week    1 drink(s) per week  . Drug Use: No  . Sexual Activity: Yes    Partners: Male   Other Topics Concern  . None   Social History Narrative   [place], moved to [place] to join mother, Traci GreenMaria Porter 2004; Catholic faith. Lives with Traci PeerSon Traci Porter (07/28/06), brother, and sister-in-law.     Housewife   Sheppard CoilManuel Garcia    FOB not involved.  She has 5 brothers, one of which was killed by gang in [place]1/08)                      Immunization:  Tdap/TD: 2011  Influenza:2013  Pneumococcal: Not a candidate  Herpes Zoster: Not a candidate  Cancer Screening:  Pap Smear: 04/2012 (negative), no history of abnormal pap  Mammogram: never (no FM of breast cancer)  Colonoscopy: Not a candidate  Dexa: Not a candidate  Physical Exam: VITALS: reviewed GEN: pleasant Hispanic female, NAD HEENT: normocephalic, bilateral TM's pearly grey, PERRL, EOMI, no scleral icterus, nasal septum midline, MMM, uvula midline, neck supple, no thyromegally CARDIAC: RRR, S1 and S2 present, no murmurs, no heaves/thrills RESP: CTAB, normal  effort BREAST:Exam performed in the presence of a chaperone. Normal breast, no masses, no nipple discharge, no axillary adenopathy ABD: soft, no tenderness, normal bowel sounds GU/GYN:Exam performed in the presence of a chaperone. Normal external anatomy, speculum exam identified white vaginal discharge, small amount of bleeding from cervix, normal appearing gravid cervix, GC/chlam and wet prep obtained EXT: no edema, 2+ radial pulses SKIN:no rash, no concerning skin lesions  ASSESSMENT & PLAN: 23 y.o. female presents for annual well woman/preventative exam and GYN exam. Please see problem specific assessment and plan.

## 2014-01-20 NOTE — Telephone Encounter (Signed)
No answer. Will call tomorrow. 

## 2014-01-20 NOTE — Assessment & Plan Note (Signed)
One week history of vaginal discharge and irritation - wet prep, GC/chlam ordered - will call patient with results

## 2014-01-20 NOTE — Assessment & Plan Note (Signed)
23 y/o female evaluated for annual preventative/GYN visit. -up to date on immunizations (flu shot provided today) -up to date on Pap Smear (due in 04/2015) -Not a candidate for mammogram (breast exam wnl today) -STD screening provided (negative HIV/RPR within past year) -Patient not interested in birth control at this time -Information on POA/living will provided -Discussed healthy diet and lifestyle

## 2014-01-21 LAB — CERVICOVAGINAL ANCILLARY ONLY
CHLAMYDIA, DNA PROBE: NEGATIVE
NEISSERIA GONORRHEA: NEGATIVE

## 2014-01-22 ENCOUNTER — Encounter: Payer: Self-pay | Admitting: Family Medicine

## 2014-01-22 NOTE — Telephone Encounter (Signed)
Attempted to contact multiple times with no answer. Left message that I will send a letter with results.

## 2014-02-03 ENCOUNTER — Other Ambulatory Visit: Payer: Self-pay | Admitting: Family Medicine

## 2014-02-03 ENCOUNTER — Telehealth: Payer: Self-pay | Admitting: *Deleted

## 2014-02-03 MED ORDER — LEVONORGESTREL 0.75 MG PO TABS: ORAL_TABLET | ORAL | Status: DC

## 2014-02-03 NOTE — Progress Notes (Signed)
Front office staff received call from patient stating that she had unprotected intercourse yesterday, would like plan B for emergency contraception, prescription for plan B sent to pharmacy.

## 2014-02-03 NOTE — Telephone Encounter (Signed)
[facility] stating that the Rx for levonorgestrel (PLAN B) 0.75 MG tablet is not available and to change to One Step 1.5 mg tab x 1 dose.  Verbal order given to change by Dr. Randolm IdolFletke.  Clovis PuMartin, Tamika L, RN

## 2014-02-18 ENCOUNTER — Encounter: Payer: Self-pay | Admitting: Family Medicine

## 2014-02-18 ENCOUNTER — Other Ambulatory Visit (HOSPITAL_COMMUNITY)
Admission: RE | Admit: 2014-02-18 | Discharge: 2014-02-18 | Disposition: A | Discharge status: Home / Self Care | Payer: Self-pay | Source: Ambulatory Visit | Attending: Family Medicine | Admitting: Family Medicine

## 2014-02-18 ENCOUNTER — Ambulatory Visit (INDEPENDENT_AMBULATORY_CARE_PROVIDER_SITE_OTHER): Payer: Self-pay | Admitting: Family Medicine

## 2014-02-18 VITALS — BP 102/65 | HR 68 | Temp 98.2°F | Wt 121.0 lb

## 2014-02-18 DIAGNOSIS — N760 Acute vaginitis: Secondary | ICD-10-CM

## 2014-02-18 DIAGNOSIS — N898 Other specified noninflammatory disorders of vagina: Secondary | ICD-10-CM

## 2014-02-18 DIAGNOSIS — A499 Bacterial infection, unspecified: Secondary | ICD-10-CM

## 2014-02-18 DIAGNOSIS — B9689 Other specified bacterial agents as the cause of diseases classified elsewhere: Secondary | ICD-10-CM

## 2014-02-18 DIAGNOSIS — Z113 Encounter for screening for infections with a predominantly sexual mode of transmission: Secondary | ICD-10-CM | POA: Insufficient documentation

## 2014-02-18 LAB — POCT WET PREP (WET MOUNT): Clue Cells Wet Prep Whiff POC: POSITIVE

## 2014-02-18 LAB — POCT URINE PREGNANCY: Preg Test, Ur: NEGATIVE

## 2014-02-18 MED ORDER — AZITHROMYCIN 250 MG PO TABS: 1000.0000 mg | ORAL_TABLET | Freq: Every day | ORAL | Status: DC

## 2014-02-18 MED ORDER — AZITHROMYCIN 250 MG PO TABS
1000.0000 mg | ORAL_TABLET | Freq: Once | ORAL | Status: AC
  Administered 2014-02-18: 1000 mg via ORAL

## 2014-02-18 MED ORDER — METRONIDAZOLE 500 MG PO TABS: 500.0000 mg | ORAL_TABLET | Freq: Two times a day (BID) | ORAL | Status: DC

## 2014-02-18 NOTE — Patient Instructions (Signed)
It was great seeing you today.    Take Flagyl twice a day for 1 week; do not drink alcohol while on this medication  I will call you with the rest of your test results; call the clinic if you don't hear from me in two days  Stop using the Vagisil  If you have any questions or concerns before then, please call the clinic at [phone].  Take Care,   Dr Wenda LowJames Avilyn Virtue  Bacterial Vaginosis Bacterial vaginosis is an infection of the vagina. It happens when too many of certain germs (bacteria) grow in the vagina. HOME CARE  Take your medicine as told by your doctor.  Finish your medicine even if you start to feel better.  Do not have sex until you finish your medicine and are better.  Tell your sex partner that you have an infection. They should see their doctor for treatment.  Practice safe sex. Use condoms. Have only one sex partner. GET HELP IF:  You are not getting better after 3 days of treatment.  You have more grey fluid (discharge) coming from your vagina than before.  You have more pain than before.  You have a fever. MAKE SURE YOU:   Understand these instructions.  Will watch your condition.  Will get help right away if you are not doing well or get worse. Document Released: 01/05/2008 Document Revised: 01/16/2013 Document Reviewed: 11/07/2012 [facility] Patient Information 2015 [place], [place]. This information is not intended to replace advice given to you by your health care provider. Make sure you discuss any questions you have with your health care provider.

## 2014-02-18 NOTE — Progress Notes (Signed)
   Subjective:    Patient ID: Traci Porter, female    DOB: 11-20-1990, 23 y.o.   MRN: [id number]  Seen for Same day visit for   CC: vaginal discharge   She reports vaginal discharge and odor x 3 days. She denies any vaginal pain but does report some itching. She has been using vagisil daily for 2 weeks. She is sexual active with one partner without condom use. Denies any dysuria, rash, joint pain or fevers.   Review of Systems   See HPI for ROS. Objective:  BP 102/65 mmHg  Pulse 68  Temp(Src) 98.2 F (36.8 C) (Oral)  Wt 121 lb (54.885 kg)  LMP 01/14/2014 (Approximate)  General: NAD Skin: warm and dry, no rashes noted Speculum Exam: Ext genitalia: wnl; Vaginal discharge: minimal thin mucus; Cervix: erythematous and appeared slightly edematous. No obvious purulent or bloody cervical discharge.  Bimanual Exam: Cervical motion tenderness present; No Vaginal wall defects; b/l adnexal tender   Assessment & Plan:  See Problem List Documentation

## 2014-02-18 NOTE — Assessment & Plan Note (Signed)
Wet prep + for BV - treated with Flagyl x 7 days Erythematous and edematous cervix with cervical motion tenderness - concerning for Chlamydia vs dermatitis due to daily vagisil use  - Stop vagisil  - GC/Ch sent and treated with Azithro 1g in clinic; will call with results

## 2014-02-19 LAB — CERVICOVAGINAL ANCILLARY ONLY
Chlamydia: NEGATIVE
Neisseria Gonorrhea: NEGATIVE

## 2014-03-24 ENCOUNTER — Encounter: Payer: Self-pay | Admitting: Family Medicine

## 2014-03-24 ENCOUNTER — Ambulatory Visit (INDEPENDENT_AMBULATORY_CARE_PROVIDER_SITE_OTHER): Payer: Self-pay | Admitting: Family Medicine

## 2014-03-24 VITALS — BP 98/62 | HR 64 | Temp 98.6°F | Ht 60.0 in | Wt 118.0 lb

## 2014-03-24 DIAGNOSIS — Z3009 Encounter for other general counseling and advice on contraception: Secondary | ICD-10-CM

## 2014-03-24 DIAGNOSIS — Z309 Encounter for contraceptive management, unspecified: Secondary | ICD-10-CM | POA: Insufficient documentation

## 2014-03-24 DIAGNOSIS — Z30011 Encounter for initial prescription of contraceptive pills: Secondary | ICD-10-CM

## 2014-03-24 LAB — POCT URINE PREGNANCY: PREG TEST UR: NEGATIVE

## 2014-03-24 MED ORDER — NORGESTIMATE-ETH ESTRADIOL 0.25-35 MG-MCG PO TABS: 1.0000 | ORAL_TABLET | Freq: Every day | ORAL | Status: DC

## 2014-03-24 NOTE — Patient Instructions (Signed)
Informacin sobre los anticonceptivos orales (Oral Contraception Information) Los anticonceptivos orales (ACO) son medicamentos que se utilizan para evitar el embarazo. Su funcin es evitar que los ovarios liberen vulos. Las hormonas de los ACO tambin hacen que el moco cervical se haga ms espeso, lo que evita que el esperma ingrese al tero. Tambin hacen que la membrana que recubre internamente al tero se vuelva ms fina, lo que no permite que el huevo fertilizado se adhiera a la pared del tero. Los ACO son muy efectivos cuando se toman exactamente como se prescriben. Sin embargo, no previenen contra las enfermedades de transmisin sexual (ETS). La prctica del sexo seguro, como el uso de preservativos, junto con la pldora, ayudan a prevenir ese tipo de enfermedades.  Antes de tomar la pldora, usted debe hacerse un examen fsico y un test de Pap. El mdico podr indicarle anlisis de sangre, si es necesario. El mdico se asegurar de que usted sea una buena candidata para usar anticonceptivos orales. Converse con su mdico acerca de los posibles efectos secundarios de los ACO que podran recetarle. Cuando se inicia el uso de ACO, puede llevar 2 a 3 meses para que su organismo se adapte a los cambios en los niveles hormonales.  TIPOS DE ANTICONCEPTIVOS ORALES  Pldora combinada: esta pldora contiene las hormonas estrgeno y progestina (progesterona sinttica). La pldora combinada viene en envases para 21 das, 28 das o 91 das. Algunos tipos de pldoras combinadas deben tomarse de manera continua (pldoras para 365 das). En los envases para 21 das, usted no tomar las pldoras durante 7 das despus de la ltima pldora. En los envases para 28 das, la pldora se toma todos los das. Las ltimas 7 no contienen hormonas. Ciertos tipos de pldoras tienen ms de 21 pldoras que contienen hormonas. En los envases para 91 das, las primeras 84 pldoras contienen ambas hormonas y las ltimas 7 pldoras  no contienen hormonas o contienen slo estrgenos.  La minipldora: esta pldora contiene la hormona progesterona solamente. Es necesario tomarla todos los das de manera continua. Es importante que las tome a la misma hora todos los das. Viene en envases de 28 pldoras. Las 28 pldoras contienen la hormona.  VENTAJAS DE LOS ANTICONCEPTIVOS ORALES  Disminuye los sntomas premenstruales.   Se usa para tratar los clicos menstruales.   Regula el ciclo menstrual.   Disminuye el ciclo menstrual abundante.   Puede mejorar el acn, segn el tipo de pldora.   Trata hemorragias uterinas anormales.   Trata el sndrome ovrico poliqustico.   Trata la endometriosis.   Pueden usarse como anticonceptivo de emergencia.  FACTORES QUE PUEDEN HACER QUE LOS ANTICONCEPTIVOS ORALES SEAN MENOS EFECTIVOS Pueden ser menos efectivos si:   Olvid tomar la pldora todos los das a la misma hora.   Tiene una enfermedad estomacal o intestinal que disminuye la absorcin de la pldora.   Ingiere simultneamente los anticonceptivos orales junto con otros medicamentos que los hacen menos efectivos, como antibiticos, ciertos medicamentos para el VIH y algunos medicamentos para las convulsiones.   Usted toma anticonceptivos orales que han vencido.   Cuando se usa el envase de 21 das, se olvida de recomenzar el uso en el da 7.  RIESGOS ASOCIADOS AL USO DE ANTICONCEPTIVOS ORALES  Los anticonceptivos orales pueden en algunos casos causar efectos secundarios como:  Dolor de cabeza.  Nuseas.  Inflamacin mamaria.  Hemorragia vaginal o manchado irregular. Las pldoras combinadas tambin se asocian a un pequeo aumento en el riesgo de:  Cogulos   sanguneos.  Ataque cardaco.  Ictus. Document Released: 01/05/2005 Document Revised: 01/16/2013 [facility] CenterExit[facility] Patient Information 2015 [place], [place]. This information is not intended to replace advice given to you by your health care provider.  Make sure you discuss any questions you have with your health care provider. Oral Contraception Use Oral contraceptive pills (OCPs) are medicines taken to prevent pregnancy. OCPs work by preventing the ovaries from releasing eggs. The hormones in OCPs also cause the cervical mucus to thicken, preventing the sperm from entering the uterus. The hormones also cause the uterine lining to become thin, not allowing a fertilized egg to attach to the inside of the uterus. OCPs are highly effective when taken exactly as prescribed. However, OCPs do not prevent sexually transmitted diseases (STDs). Safe sex practices, such as using condoms along with an OCP, can help prevent STDs. Before taking OCPs, you may have a physical exam and Pap test. Your health care provider may also order blood tests if necessary. Your health care provider will make sure you are a good candidate for oral contraception. Discuss with your health care provider the possible side effects of the OCP you may be prescribed. When starting an OCP, it can take 2 to 3 months for the body to adjust to the changes in hormone levels in your body.  HOW TO TAKE ORAL CONTRACEPTIVE PILLS Your health care provider may advise you on how to start taking the first cycle of OCPs. Otherwise, you can:   Start on day 1 of your menstrual period. You will not need any backup contraceptive protection with this start time.   Start on the first Sunday after your menstrual period or the day you get your prescription. In these cases, you will need to use backup contraceptive protection for the first week.   Start the pill at any time of your cycle. If you take the pill within 5 days of the start of your period, you are protected against pregnancy right away. In this case, you will not need a backup form of birth control. If you start at any other time of your menstrual cycle, you will need to use another form of birth control for 7 days. If your OCP is the type called  a minipill, it will protect you from pregnancy after taking it for 2 days (48 hours). After you have started taking OCPs:   If you forget to take 1 pill, take it as soon as you remember. Take the next pill at the regular time.   If you miss 2 or more pills, call your health care provider because different pills have different instructions for missed doses. Use backup birth control until your next menstrual period starts.   If you use a 28-day pack that contains inactive pills and you miss 1 of the last 7 pills (pills with no hormones), it will not matter. Throw away the rest of the non-hormone pills and start a new pill pack.  No matter which day you start the OCP, you will always start a new pack on that same day of the week. Have an extra pack of OCPs and a backup contraceptive method available in case you miss some pills or lose your OCP pack.  HOME CARE INSTRUCTIONS   Do not smoke.   Always use a condom to protect against STDs. OCPs do not protect against STDs.   Use a calendar to mark your menstrual period days.   Read the information and directions that came with your OCP. Talk  to your health care provider if you have questions.  SEEK MEDICAL CARE IF:   You develop nausea and vomiting.   You have abnormal vaginal discharge or bleeding.   You develop a rash.   You miss your menstrual period.   You are losing your hair.   You need treatment for mood swings or depression.   You get dizzy when taking the OCP.   You develop acne from taking the OCP.   You become pregnant.  SEEK IMMEDIATE MEDICAL CARE IF:   You develop chest pain.   You develop shortness of breath.   You have an uncontrolled or severe headache.   You develop numbness or slurred speech.   You develop visual problems.   You develop pain, redness, and swelling in the legs.  Document Released: 03/17/2011 Document Revised: 08/12/2013 Document Reviewed: 09/16/2012 [facility] CenterExitCare Patient  Information 2015 [place], [place]. This information is not intended to replace advice given to you by your health care provider. Make sure you discuss any questions you have with your health care provider.

## 2014-03-24 NOTE — Assessment & Plan Note (Signed)
Patient presents to discuss birth control options. Discussed Implanon/Depo/IUD/OCP's/vaginal ring. Patient interested in OCP's. POC urine pregnancy test negative. -start Sprintec -discussed initiation of OCP

## 2014-03-24 NOTE — Progress Notes (Signed)
   Subjective:    Patient ID: Traci BoysDora Porter, female    DOB: [birth year], 23 y.o.   MRN: [id number]  HPI 23 y/o female presents to discuss contraception.  Patient has one son, previously on Implanon however did not tolerate due to bleeding, interested in contraception has she has new female sexual partner, LMP 02/24/14, reports periods every 4-5 weeks, last 4-5 days, interested in OCP's   Review of Systems  Constitutional: Negative for fever, chills and fatigue.  Respiratory: Negative for cough and shortness of breath.   Cardiovascular: Negative for chest pain.  Gastrointestinal: Negative for nausea, diarrhea and constipation.       Objective:   Physical Exam Vitals: reviewed Gen: pleasant Hispanic female, NAD GU: deferred as completed one month ago  POC urine pregnancy test negative    Assessment & Plan:  Please see problem specific assessment and plan.

## 2014-04-07 ENCOUNTER — Ambulatory Visit: Payer: Self-pay

## 2014-05-19 ENCOUNTER — Encounter: Payer: Self-pay | Admitting: Family Medicine

## 2014-05-29 ENCOUNTER — Encounter: Payer: Self-pay | Admitting: Family Medicine

## 2014-06-24 ENCOUNTER — Encounter: Payer: Self-pay | Admitting: Family Medicine

## 2014-06-24 ENCOUNTER — Ambulatory Visit (INDEPENDENT_AMBULATORY_CARE_PROVIDER_SITE_OTHER): Payer: Self-pay | Admitting: Family Medicine

## 2014-06-24 VITALS — BP 105/71 | HR 71 | Temp 98.3°F | Ht 60.0 in | Wt 124.8 lb

## 2014-06-24 DIAGNOSIS — Z0000 Encounter for general adult medical examination without abnormal findings: Secondary | ICD-10-CM

## 2014-06-24 DIAGNOSIS — Z3009 Encounter for other general counseling and advice on contraception: Secondary | ICD-10-CM

## 2014-06-24 DIAGNOSIS — B360 Pityriasis versicolor: Secondary | ICD-10-CM

## 2014-06-24 LAB — CBC WITH DIFFERENTIAL/PLATELET
Basophils Absolute: 0 10*3/uL (ref 0.0–0.1)
Basophils Relative: 0 % (ref 0–1)
EOS ABS: 1.4 10*3/uL — AB (ref 0.0–0.7)
Eosinophils Relative: 17 % — ABNORMAL HIGH (ref 0–5)
HCT: 41.1 % (ref 36.0–46.0)
HEMOGLOBIN: 13.8 g/dL (ref 12.0–15.0)
LYMPHS ABS: 1.8 10*3/uL (ref 0.7–4.0)
Lymphocytes Relative: 22 % (ref 12–46)
MCH: 30 pg (ref 26.0–34.0)
MCHC: 33.6 g/dL (ref 30.0–36.0)
MCV: 89.3 fL (ref 78.0–100.0)
MONO ABS: 0.8 10*3/uL (ref 0.1–1.0)
MONOS PCT: 10 % (ref 3–12)
MPV: 10 fL (ref 8.6–12.4)
Neutro Abs: 4.2 10*3/uL (ref 1.7–7.7)
Neutrophils Relative %: 51 % (ref 43–77)
Platelets: 270 10*3/uL (ref 150–400)
RBC: 4.6 MIL/uL (ref 3.87–5.11)
RDW: 13.9 % (ref 11.5–15.5)
WBC: 8.3 10*3/uL (ref 4.0–10.5)

## 2014-06-24 LAB — COMPREHENSIVE METABOLIC PANEL
ALBUMIN: 4 g/dL (ref 3.5–5.2)
ALT: 14 U/L (ref 0–35)
AST: 17 U/L (ref 0–37)
Alkaline Phosphatase: 72 U/L (ref 39–117)
BUN: 8 mg/dL (ref 6–23)
CALCIUM: 8.9 mg/dL (ref 8.4–10.5)
CHLORIDE: 106 meq/L (ref 96–112)
CO2: 27 mEq/L (ref 19–32)
Creat: 0.61 mg/dL (ref 0.50–1.10)
Glucose, Bld: 86 mg/dL (ref 70–99)
Potassium: 3.8 mEq/L (ref 3.5–5.3)
SODIUM: 141 meq/L (ref 135–145)
Total Bilirubin: 0.4 mg/dL (ref 0.2–1.2)
Total Protein: 7 g/dL (ref 6.0–8.3)

## 2014-06-24 LAB — LIPID PANEL
Cholesterol: 131 mg/dL (ref 0–200)
HDL: 54 mg/dL (ref >=46)
LDL Cholesterol: 54 mg/dL (ref 0–99)
TRIGLYCERIDES: 113 mg/dL (ref <150)
Total CHOL/HDL Ratio: 2.4 Ratio
VLDL: 23 mg/dL (ref 0–40)

## 2014-06-24 LAB — POCT SKIN KOH: Skin KOH, POC: NEGATIVE

## 2014-06-24 NOTE — Assessment & Plan Note (Signed)
Patient presents for evaluation of rash. Appearance is consistent with tinea versicolor versus eczema. Skin scraping obtained. -Will defer treatment until findings of skin scraping have been obtained

## 2014-06-24 NOTE — Assessment & Plan Note (Signed)
Patient stopped Sprintec approximate one month ago secondary to rash. Her rash has persisted which suggests against an allergy to the contraceptive. This was discussed with the patient. She is not interested in restarting Sprintec at this time. Patient does report heavier period on 06/18/2014. This lasted approximately 5 days. -Check CBC

## 2014-06-24 NOTE — Patient Instructions (Signed)
Rash - Dr. Randolm IdolFletke will send the skin scraping for culture, I will call you with the results  Dr. Randolm IdolFletke will call you with your lab results.   You are up to date on your pap smear, due in early 2017

## 2014-06-24 NOTE — Progress Notes (Signed)
   Subjective:    Patient ID: Toula MoosDora Flores Hernandez, female    DOB: [birth year], 24 y.o.   MRN: [id number]  HPI 24 year old female presents for evaluation of rash.  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, this was previously resistant to ketoconazole shampoo, patient has intermittently applied topical emollients which provided minimal relief of symptoms, no associated fevers or chills  Birth control- patient developed "rash" with her birth control pill, she was started on Sprintec during our previous visit, the rash has persisted despite stopping the Sprintec approximately one month ago, patient is not interested in continuing this medication at this time, she is currently sexually active, no current vaginal complaints   Review of Systems  Constitutional: Negative for fever, chills and fatigue.  Respiratory: Negative for cough and shortness of breath.   Cardiovascular: Negative for chest pain and leg swelling.       Objective:   Physical Exam Vitals: Reviewed Gen.: Pleasant Hispanic female, no acute distress HEENT: Normocephalic, pupils are equal round and reactive to light, extraocular movements are intact, no scleral icterus, neck was supple, no thyromegaly Cardiac: Regular rate and rhythm, S1 and S2 present, no murmurs, no heaves or thrills Respiratory: Clear to auscultation bilaterally, normal effort Skin: Multiple areas of hyperpigmentation present on the abdomen and below bilateral breasts, some excoriations are present, no crusting/erythema/discharge  Skin scraping obtained from under the right breast     Assessment & Plan:  Please see problem specific assessment and plan.

## 2014-06-25 ENCOUNTER — Telehealth: Payer: Self-pay | Admitting: Family Medicine

## 2014-06-25 DIAGNOSIS — R21 Rash and other nonspecific skin eruption: Secondary | ICD-10-CM

## 2014-06-25 MED ORDER — HYDROCORTISONE 2.5 % EX CREA: TOPICAL_CREAM | Freq: Two times a day (BID) | CUTANEOUS | Status: DC

## 2014-06-25 NOTE — Telephone Encounter (Signed)
Discussed lab results with patient. KOH negative. Will attempt trial of topical steroid to treat rash.

## 2014-06-25 NOTE — Assessment & Plan Note (Signed)
KOH negative for tinea. -attempt trial of Hydrocortisone cream.

## 2014-09-16 ENCOUNTER — Ambulatory Visit: Payer: Self-pay

## 2014-10-08 ENCOUNTER — Ambulatory Visit: Payer: Self-pay

## 2014-10-08 ENCOUNTER — Telehealth: Payer: Self-pay | Admitting: Family Medicine

## 2014-10-08 DIAGNOSIS — Z0000 Encounter for general adult medical examination without abnormal findings: Secondary | ICD-10-CM

## 2014-10-08 NOTE — Telephone Encounter (Signed)
Tia - I have placed a referral to a dentist. Please complete and notify patient. Thanks.

## 2014-10-08 NOTE — Telephone Encounter (Signed)
Patient now has the orange card and needs a referral to the dental clinic.  She needs her teeth cleaned and has been having tooth pain.

## 2015-01-13 ENCOUNTER — Encounter: Payer: Self-pay | Admitting: Family Medicine

## 2015-01-13 ENCOUNTER — Ambulatory Visit (INDEPENDENT_AMBULATORY_CARE_PROVIDER_SITE_OTHER): Payer: Self-pay | Admitting: Family Medicine

## 2015-01-13 VITALS — BP 104/51 | HR 71 | Temp 98.5°F | Wt 133.0 lb

## 2015-01-13 DIAGNOSIS — H109 Unspecified conjunctivitis: Secondary | ICD-10-CM | POA: Insufficient documentation

## 2015-01-13 MED ORDER — NEOMYCIN-POLYMYXIN-DEXAMETH 3.5-10000-0.1 OP SUSP: 1.0000 [drp] | OPHTHALMIC | Status: DC

## 2015-01-13 NOTE — Patient Instructions (Signed)
Conjuntivitis °(Conjunctivitis) °Usted padece conjuntivitis. La conjuntivitis se conoce frecuentemente como "ojo rojo". Las causas de la conjuntivitis pueden ser las infecciones virales o bacterianas, alergias o lesiones. Los síntomas son: enrojecimiento de la superficie del ojo, picazón, molestias y en algunos casos, secreciones. La secreción se deposita en las pestañas. Las infecciones virales causan una secreción acuosa, mientras que las infecciones bacterianas causan una secreción amarillenta y espesa. La conjuntivitis es muy contagiosa y se disemina por el contacto directo. °Como parte del tratamiento le indicaran gotas oftálmicas con antibióticos. Antes de utilizar el medicamento, retire todas la secreciones del ojo, lavándolo suavemente con agua tibia y algodón. Continúe con el uso del medicamento hasta que se haya despertado dos mañanas sin secreción ocular. No se frote los ojos. Esto hace que aumente la irritación y favorece la extensión de la infección. No utilice las mismas toallas que los miembros de su familia. Lávese las manos con agua y jabón antes y después de tocarse los ojos. Utilice compresas frías para reducir el dolor y anteojos de sol para disminuir la irritación que ocasiona la luz. No debe usarse maquillaje ni lentes de contacto hasta que la infección haya desaparecido. °SOLICITE ATENCIÓN MÉDICA SI: °· Sus síntomas no mejoran luego de 3 días de tratamiento. °· Aumenta el dolor o las dificultades para ver. °· La zona externa de los párpados está muy roja o hinchada. °Document Released: 03/28/2005 Document Revised: 06/20/2011 °ExitCare® Patient Information ©2015 ExitCare, LLC. This information is not intended to replace advice given to you by your health care provider. Make sure you discuss any questions you have with your health care provider. ° °

## 2015-01-19 NOTE — Assessment & Plan Note (Signed)
Painful red eye, unilateral, watery discharge, 2 days, likely viral - will rx maxitrol for pain relief to to prevent secondary bacterial infection - rtc for worsening, vision change or lack of improvement in 2-3 days

## 2015-01-19 NOTE — Progress Notes (Signed)
   Subjective:   Traci Porter is a 24 y.o. female with a history of depression here for eye redness  Patient reports 2 days of worsening eye pain and redness. No treatments tried, nothing makes it better or worse. No fever. Small amount of watery discharge. No known sick contacts. Has not noticed any changes in her vision.  Review of Systems:  Per HPI. All other systems reviewed and are negative.  PMH, PSH, Medications, Allergies, and FmHx reviewed and updated in EMR.  Social History: never smoker  Objective:  BP 104/51 mmHg  Pulse 71  Temp(Src) 98.5 F (36.9 C) (Oral)  Wt 133 lb (60.328 kg)  Gen:  24 y.o. female in NAD HEENT: NCAT, MMM, EOMI, PERRL, anicteric sclerae, L-sided conjunctival injection CV: RRR, no MRG, no JVD Resp: Non-labored, CTAB, no wheezes noted Abd: Soft, NTND, BS present, no guarding or organomegaly Ext: WWP, no edema MSK: Full ROM, strength intact Neuro: Alert and oriented, speech normal  Vision 20/25, unchanged from prior    Chemistry      Component Value Date/Time   NA 141 06/24/2014 0945   K 3.8 06/24/2014 0945   CL 106 06/24/2014 0945   CO2 27 06/24/2014 0945   BUN 8 06/24/2014 0945   CREATININE 0.61 06/24/2014 0945   CREATININE 0.62 03/12/2013 1010      Component Value Date/Time   CALCIUM 8.9 06/24/2014 0945   ALKPHOS 72 06/24/2014 0945   AST 17 06/24/2014 0945   ALT 14 06/24/2014 0945   BILITOT 0.4 06/24/2014 0945      Lab Results  Component Value Date   WBC 8.3 06/24/2014   HGB 13.8 06/24/2014   HCT 41.1 06/24/2014   MCV 89.3 06/24/2014   PLT 270 06/24/2014   Lab Results  Component Value Date   TSH 1.034 08/31/2012   Lab Results  Component Value Date   HGBA1C 5.1 08/31/2012   Assessment & Plan:     Traci Porter is a 24 y.o. female here for red eye  Conjunctivitis Painful red eye, unilateral, watery discharge, 2 days, likely viral - will rx maxitrol for pain relief to to prevent secondary bacterial  infection - rtc for worsening, vision change or lack of improvement in 2-3 days    Beverely Low, MD, MPH [facility] PGY-3 01/19/2015 2:56 PM

## 2015-01-23 ENCOUNTER — Other Ambulatory Visit (HOSPITAL_COMMUNITY)
Admission: RE | Admit: 2015-01-23 | Discharge: 2015-01-23 | Disposition: A | Discharge status: Home / Self Care | Payer: Self-pay | Source: Ambulatory Visit | Attending: Family Medicine | Admitting: Family Medicine

## 2015-01-23 ENCOUNTER — Encounter: Payer: Self-pay | Admitting: Family Medicine

## 2015-01-23 ENCOUNTER — Ambulatory Visit (INDEPENDENT_AMBULATORY_CARE_PROVIDER_SITE_OTHER): Payer: Self-pay | Admitting: Family Medicine

## 2015-01-23 VITALS — BP 108/69 | HR 67 | Temp 98.4°F | Wt 126.6 lb

## 2015-01-23 DIAGNOSIS — R102 Pelvic and perineal pain: Secondary | ICD-10-CM

## 2015-01-23 DIAGNOSIS — Z23 Encounter for immunization: Secondary | ICD-10-CM

## 2015-01-23 DIAGNOSIS — Z7251 High risk heterosexual behavior: Secondary | ICD-10-CM

## 2015-01-23 DIAGNOSIS — Z113 Encounter for screening for infections with a predominantly sexual mode of transmission: Secondary | ICD-10-CM | POA: Insufficient documentation

## 2015-01-23 LAB — POCT WET PREP (WET MOUNT): CLUE CELLS WET PREP WHIFF POC: NEGATIVE

## 2015-01-23 LAB — POCT URINE PREGNANCY: PREG TEST UR: NEGATIVE

## 2015-01-23 NOTE — Progress Notes (Signed)
    Subjective: CC: abdominal HPI: Patient is a 24 y.o. female presenting to clinic today for same day appt. Concerns today include:  1. Abdominal pain Started about 1 week ago.  Notes that she experiences pain with intercourse and postcoital bleeding.  No nausea, vomiting, diarrhea or constipation.  Denies any fevers.  Patient's last menstrual period was 01/09/2015 (exact date).  Does not use condoms or OCPs.  Has intercourse with 1 female partner.  Denies vaginal discharge, vaginal odor.  Occ vaginal itching.  No h/o STI that she is aware of.  Social History Reviewed: non smoker. FamHx and MedHx updated.  Please see EMR. Health Maintenance: Flu shot today.  ROS: Per HPI  Objective: Office vital signs reviewed. BP 108/69 mmHg  Pulse 67  Temp(Src) 98.4 F (36.9 C) (Oral)  Wt 126 lb 9.6 oz (57.425 kg)  LMP 01/09/2015 (Exact Date)  Physical Examination:  General: Awake, alert, well nourished, NAD HEENT: Normal, MMM, EOMI GI: soft, NT/ND,+BS x4, no palpable masses, no guarding, rebound or evidence of peritonitis  GU: normal external vaginal tissue, cervix anteverted, friable, mild leukorrhea, no cervical motion TTP, no adnexal masses.  Results for orders placed or performed in visit on 01/23/15 (from the past 24 hour(s))  POCT Wet Prep Mellody Drown([place]et [place])     Status: Abnormal   Collection Time: 01/23/15  5:00 AM  Result Value Ref Range   Source Wet Prep POC vaginal    WBC, Wet Prep HPF POC 5-10    Bacteria Wet Prep HPF POC Moderate (A) None, Few   Clue Cells Wet Prep HPF POC None None   Clue Cells Wet Prep Whiff POC Negative Whiff    Yeast Wet Prep HPF POC None    Trichomonas Wet Prep HPF POC none   POCT urine pregnancy     Status: None   Collection Time: 01/23/15  5:00 AM  Result Value Ref Range   Preg Test, Ur Negative Negative   Assessment/ Plan: 24 y.o. female with  1. Pelvic pain in female.  No evidence of BV, trich or yeast on wet prep.  Upreg negative.  Only 5-10 WBCs  which makes Chlamydia less likely but cannot r/o GC or CT.  Would consider possible fibroid.  - Consider pelvic ultrasound if no improvement. - Cervicovaginal ancillary only - POCT Wet Prep [facility]([place]) - HIV antibody - POCT urine pregnancy - return precautions reviewed - Follow up with PCP as scheduled for routine care  2. High risk sexual behavior - Cervicovaginal ancillary only - POCT Wet Prep ([place]et [place]) - HIV antibody - POCT urine pregnancy  Raliegh IpAshly M Gottschalk, DO PGY-2, [facility]

## 2015-01-23 NOTE — Patient Instructions (Addendum)
I will call you with the results of your tests.  If your pain gets worse, you develop nausea and vomiting or fevers, please seek immediate medical attention.  Otherwise, plan to follow up with Dr Randolm IdolFletke as needed for routine care.  Dolor plvico, mujeres (Pelvic Pain, Female)  Las causa del dolor plvico en la mujer pueden ser muchas y pueden tener su origen en diferentes lugares. El dolor plvico es el que aparece en la mitad inferior del abdomen y [place] las caderas. Puede aparecer durante en un perodo corto de tiempo (agudo)o puede ser recurrente (crnico). Esta afeccin puede estar relacionada con trastornos que afectan a los rganos reproductivos femeninos (ginecolgica), pero tambin puede deberse a problemas en la vejiga, clculos renales, complicaciones intestinales, o problemas musculares o esquelticos. Es Garment/textile technologistimportante solicitar ayuda de inmediato, sobre todo si ha sido intenso, [place]udo, o ha aparecido de Wellsite geologistmanera sbita como un dolor inusual. Tambin es importante obtener ayuda de inmediato, ya que algunos tipos de dolor plvico puede poner en peligro la vida.  CAUSAS  A continuacin veremos algunas de las causas del dolor plvico. Las causas pueden clasificarse de diferentes modos.   Ginecolgica.  Enfermedad inflamatoria plvica.  Infecciones de transmisin sexual.  Quiste de ovario o torsin de un ligamento ovrico ( torsin ovrica).  La membrana que recubre internamente al tero desarrollndose fuera del tero (endometriosis).  Fibromas, quistes o tumores.  Ovulacin.  Embarazo.  Embarazo fuera del tero (embarazo ectpico).  Aborto espontneo.  Trabajo de App[place]parto.  Desprendimiento de la placenta o ruptura del tero.  Infecciones.  Infeccin uterina (endometritis).  Infeccin de la vejiga.  Diverticulitis.  Aborto relacionado con una infeccin uterina (aborto sptico).  Vejiga.  Inflamacin de la vejiga (cistitis).  Clculos  renales.  Gastrointenstinal.  Estreimiento.  Diverticulitis.  Neurolgico.  Traumatismos.  Sentir dolor plvico debido a causas mentales o emocionales (psicosomtico).  Tumores en el intestino o en la pelvis. EVALUACIN  El mdico har una historia clnica detallada segn sus sntomas. Incluir los cambios recientes en su salud, una cuidadosa historia ginecolgica de sus periodos (menstruaciones) y Neomia Dearuna historia de su actividad sexual. Los antecedentes familiares y la historia clnica tambin son importantes. Su mdico podr indicar un examen plvico. El examen plvico ayudar a identificar la ubicacin y la gravedad del Engineer, miningdolor. Tambin ayudar a International Paperevaluar los rganos que pueden estar involucrados. Myrtha Mantis. Para identificar la causa del dolor plvico y tratarlo adecuadamente, el mdico puede indicar estudios. Estas pruebas pueden ser:   Test de embarazo.  Ecografa plvica.  Radiografa del abdomen.  Un anlisis de [place] o la evaluacin de la secrecin vaginal.  Anlisis de [place]. INSTRUCCIONES PARA EL CUIDADO EN EL HOGAR   Solo tome medicamentos de venta libre o recetados para Chief Technology Officerel dolor, Dentistmalestar o fiebre, segn las indicaciones del mdico.   Haga reposo segn las indicaciones del mdico.   Consuma una dieta balanceada.   Beba gran cantidad de lquido para mantener la orina de tono claro o amarillo plido.   Evite las relaciones sexuales, Counsellorsi le producen dolor.   Aplique compresas calientes o fras en la zona baja del abdomen segn cual le calme el dolor.   Evite las situaciones estresantes.   Lleve un registro del dolor plvico. Anote cundo comenz, dnde se Optometristlocaliza el dolor y si hay cosas que parecen estar asociadas con el dolor, como algn alimento o su ciclo menstrual.  Concurra a las visitas de control con el mdico, segn las indicaciones.  SOLICITE ATENCIN MDICA SI:  Los medicamentos no Editor, commissioning.  Tiene flujo vaginal anormal. SOLICITE ATENCIN  MDICA DE INMEDIATO SI:   Tiene un sangrado abundante por la vagina.   El dolor plvico aumenta.   Se siente mareada o sufre un desmayo.   Siente escalofros.   Siente dolor intenso al Geographical information systems officer u observa sangre en la orina.   Tiene diarrea o vmitos que no puede controlar.   Tiene fiebre o sntomas que persisten durante ms de [address]  [address].  Tiene fiebre y los sntomas [address].   Ha sido abusada fsica o sexualmente.  ASEGRESE DE QUE:   Comprende estas instrucciones.  Controlar su enfermedad.  Solicitar ayuda de inmediato si no mejora o si empeora.   Esta informacin no tiene Theme park manager el consejo del mdico. Asegrese de hacerle al mdico cualquier pregunta que tenga.   Document Released: 06/24/2008 Document Revised: 08/12/2014 Elsevier Interactive Patient Education [employer]

## 2015-01-24 LAB — HIV ANTIBODY (ROUTINE TESTING W REFLEX): HIV 1&2 Ab, 4th Generation: NONREACTIVE

## 2015-01-26 ENCOUNTER — Encounter: Payer: Self-pay | Admitting: Family Medicine

## 2015-01-26 LAB — CERVICOVAGINAL ANCILLARY ONLY
Chlamydia: NEGATIVE
Neisseria Gonorrhea: NEGATIVE

## 2015-04-28 ENCOUNTER — Encounter: Payer: Self-pay | Admitting: Internal Medicine

## 2015-04-28 ENCOUNTER — Ambulatory Visit (INDEPENDENT_AMBULATORY_CARE_PROVIDER_SITE_OTHER): Payer: Self-pay | Admitting: Internal Medicine

## 2015-04-28 VITALS — BP 103/70 | HR 61 | Temp 98.2°F | Wt 131.2 lb

## 2015-04-28 DIAGNOSIS — N898 Other specified noninflammatory disorders of vagina: Secondary | ICD-10-CM

## 2015-04-28 DIAGNOSIS — B379 Candidiasis, unspecified: Secondary | ICD-10-CM

## 2015-04-28 DIAGNOSIS — Z0000 Encounter for general adult medical examination without abnormal findings: Secondary | ICD-10-CM

## 2015-04-28 DIAGNOSIS — L298 Other pruritus: Secondary | ICD-10-CM

## 2015-04-28 LAB — POCT UA - MICROSCOPIC ONLY

## 2015-04-28 LAB — POCT WET PREP (WET MOUNT): Clue Cells Wet Prep Whiff POC: NEGATIVE

## 2015-04-28 LAB — POCT URINALYSIS DIPSTICK
Bilirubin, UA: NEGATIVE
Blood, UA: NEGATIVE
Glucose, UA: NEGATIVE
KETONES UA: NEGATIVE
Nitrite, UA: NEGATIVE
PH UA: 7
PROTEIN UA: NEGATIVE
Spec Grav, UA: 1.015
UROBILINOGEN UA: 0.2

## 2015-04-28 IMAGING — CR DG CERVICAL SPINE COMPLETE 4+V
6 series · 6 of 6 positions shown · non-contrast
Comparison: None.

CLINICAL DATA: Neck pain, some shoulder pain, no trauma

CERVICAL SPINE - COMPLETE 4+ VIEW

[w c-spine lat]
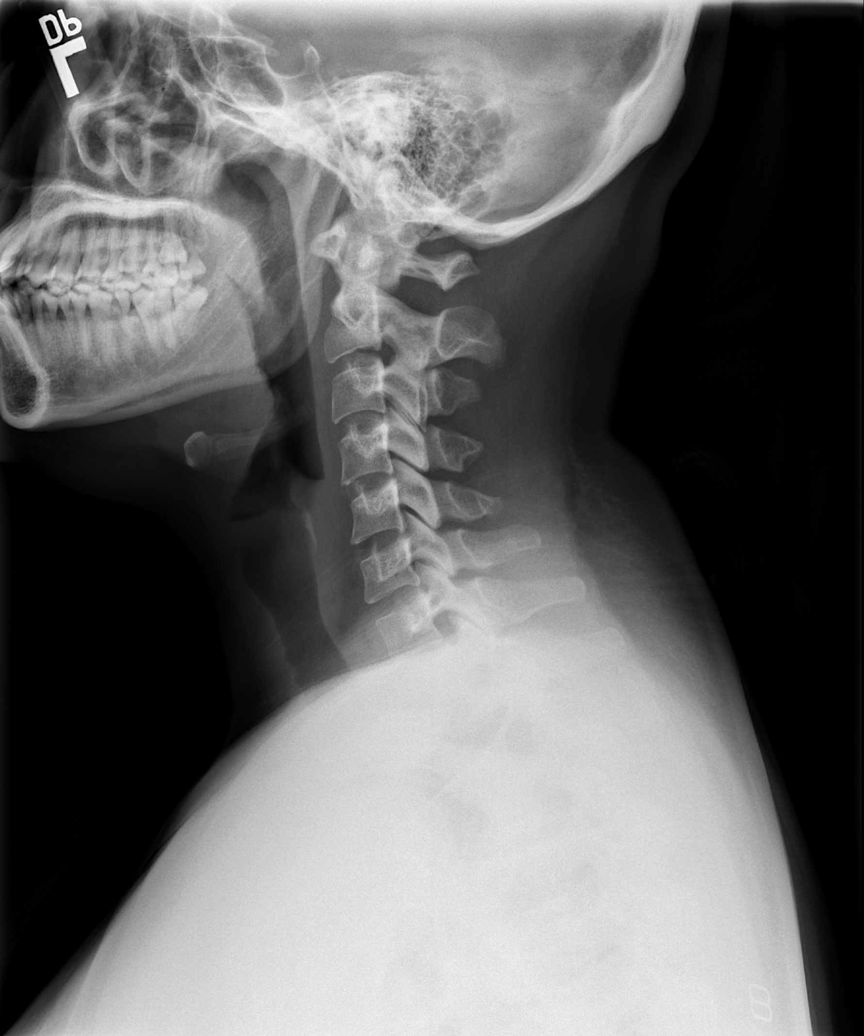

[w c-spine oblique (1 of 2)]
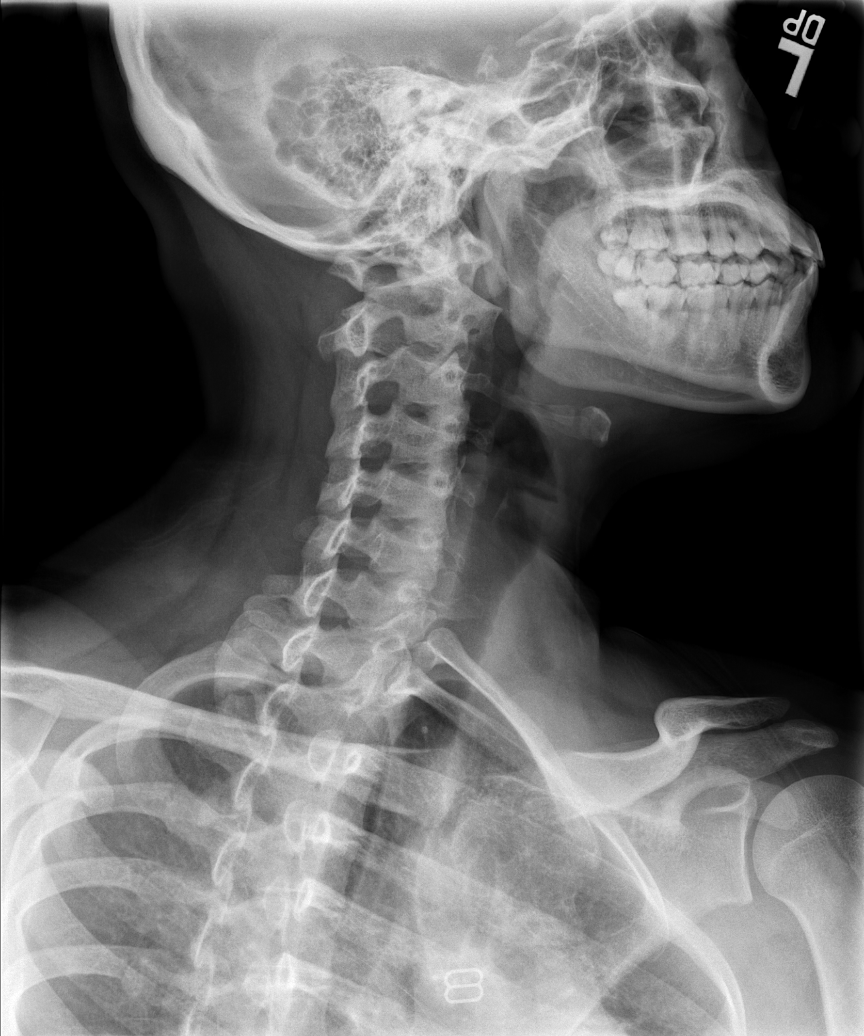

[w c-spine oblique (2 of 2)]
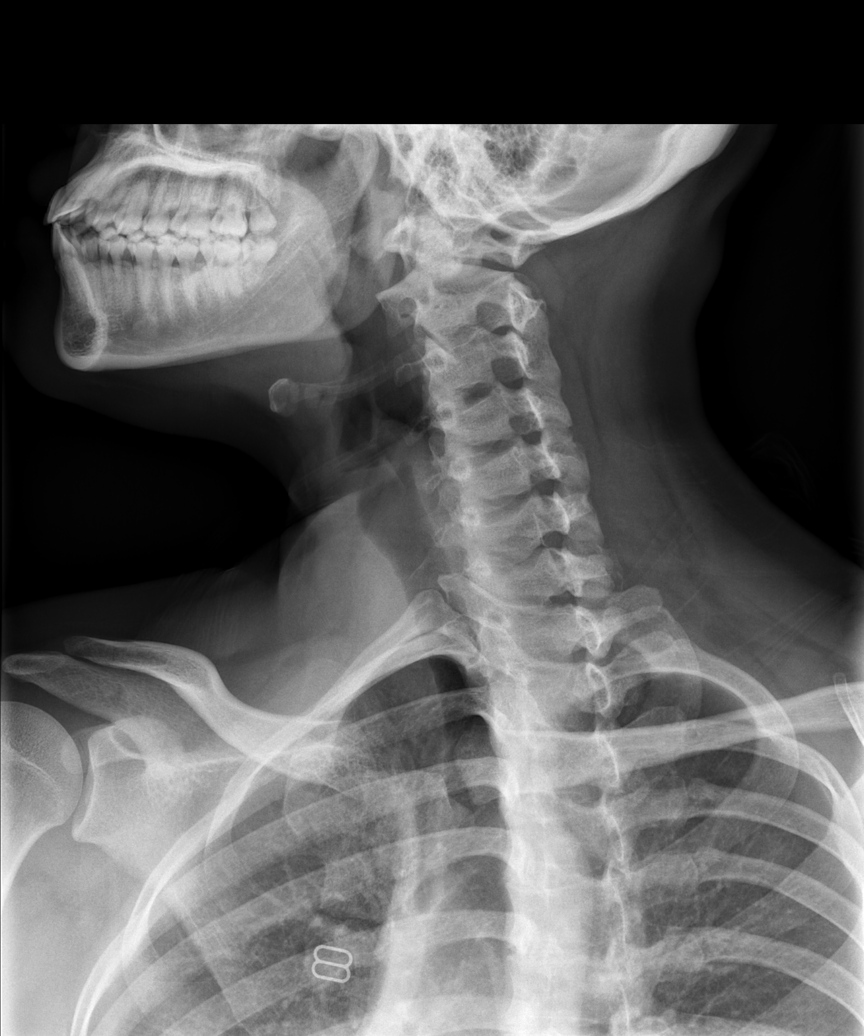

[w c-spine a.p. *]
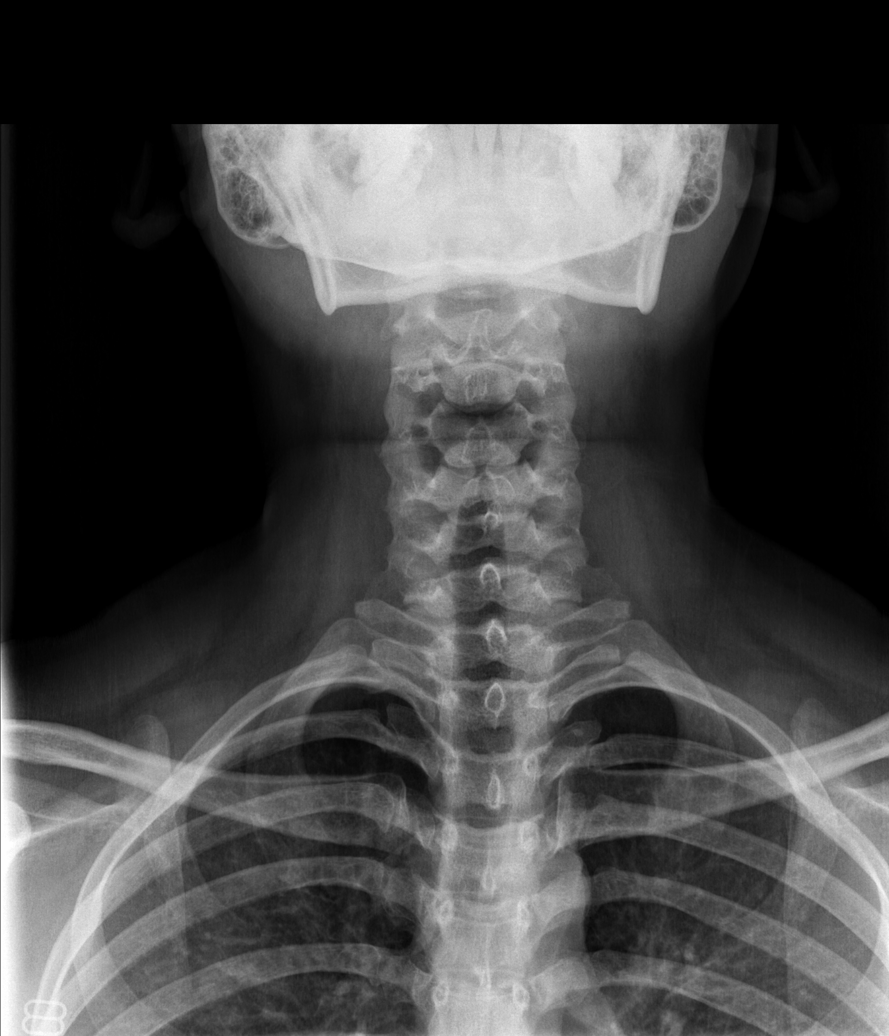

[w c-spine odontoid * (1 of 2)]
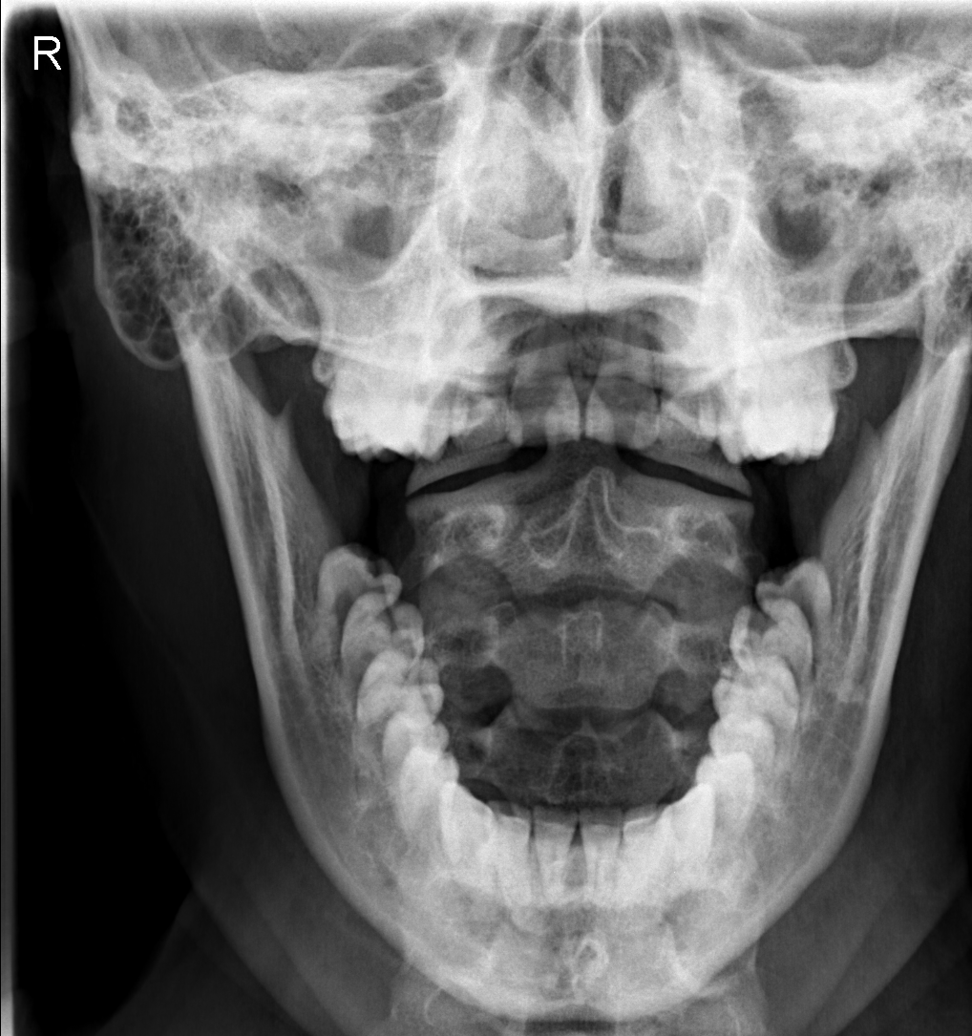

[w c-spine odontoid * (2 of 2)]
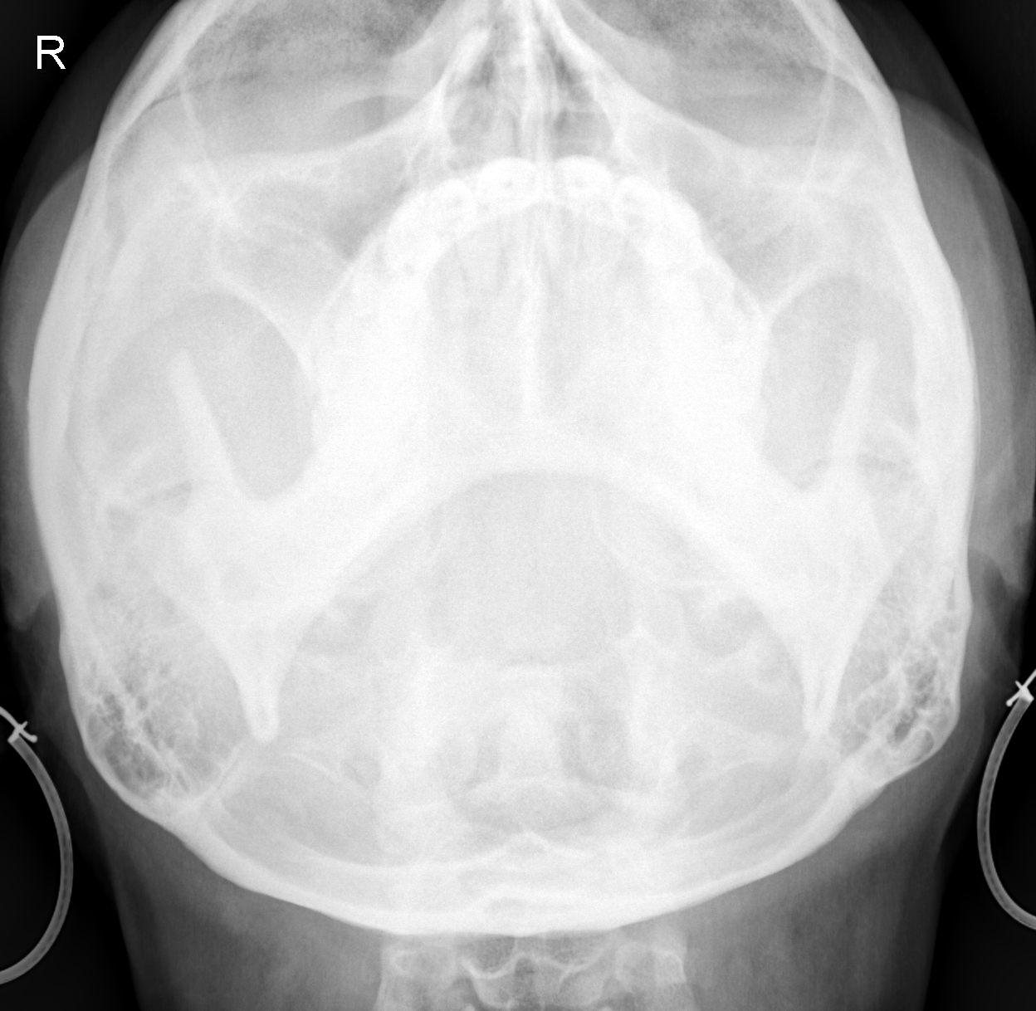

[6 of 6 positions shown; findings below may reference images not displayed]

FINDINGS: The cervical vertebrae are in normal alignment.
Intervertebral disc spaces appear normal.  No prevertebral soft
tissue swelling is seen.  On oblique views the foramina are widely
patent.  The odontoid process is intact, and the lung apices are
clear.
IMPRESSION: Normal alignment with normal disc spaces.  No foraminal narrowing.

## 2015-04-28 MED ORDER — FLUCONAZOLE 150 MG PO TABS: 150.0000 mg | ORAL_TABLET | Freq: Once | ORAL | Status: DC

## 2015-04-28 NOTE — Patient Instructions (Signed)
[name],  Today we got labs for urinary tract infection and to test for yeast. There was some yeast present, which can cause itching and burning.  Please take 1 pill of diflucan. This should relieve symptoms. If you still have itching, you may take 1 more pill 72 hours later.  I will call you with any abnormal results from the Pap smear. Otherwise, I will send a letter.  Thank you! Dr. Sampson Goon

## 2015-04-28 NOTE — Progress Notes (Signed)
   Subjective:    Patient ID: Traci Porter, female    DOB: 1991-03-12, 25 y.o.   MRN: [id number]  HPI [name] is a 25 y.o female who presents with vaginal itching and dysuria that started last Friday. She last had vaginal intercourse Sunday, which was somewhat painful. She denies any increased discharge or odor. Her only sexual partner is her husband. She is not interested in STD testing and had a negative HIV screen in Oct. 2016. She does not use any birth control, as she would like to become pregnant. Her LMP was around 12/20, and she reports irregular periods. She denies any abdominal pain.   Review of Systems  Constitutional: Negative for fever.  Gastrointestinal: Negative for nausea, vomiting, abdominal pain and diarrhea.  Genitourinary: Positive for dysuria and dyspareunia. Negative for urgency and frequency.  Musculoskeletal: Negative for back pain.   Never Smoker.     Objective: Blood pressure 103/70, pulse 61, temperature 98.2 F (36.8 C), temperature source Oral, weight 131 lb 3.2 oz (59.512 kg), last menstrual period 03/31/2015.   Physical Exam  Constitutional: She is oriented to person, place, and time. She appears well-developed and well-nourished. No distress.  Abdominal: Soft. There is no tenderness. There is no rebound and no guarding.  Genitourinary: Vaginal discharge (Minimal thick white discharge. BRB from cervix. ) found.  No lesions appreciated on speculum exam. No cervical motion tenderness on bimanual exam.  Musculoskeletal: Normal range of motion.  Neurological: She is alert and oriented to person, place, and time.  Skin: Skin is warm and dry.   UA: negative nitrites, moderate leukocytes UA microscopy: 10-15 epithelial cells, 1+ bacteria, few yeast  Wet prep: Moderate yeast    Assessment & Plan:  [name] is a 24-y.o. female with vaginal itching and dysuria. Wet prep was positive for yeast. No systemic signs of infection and  lack of cervical motion tenderness make PID unlikely.   Return as needed.   Yeast infection - Prescribed diflucan.  - Recommended against using scented products like vagisil.   Preventative health care - Obtained Pap smear, as patient was due.    Dani Gobble, MD [facility], PGY-1

## 2015-04-29 DIAGNOSIS — B379 Candidiasis, unspecified: Secondary | ICD-10-CM | POA: Insufficient documentation

## 2015-04-29 NOTE — Assessment & Plan Note (Signed)
-   Obtained Pap smear, as patient was due.

## 2015-04-29 NOTE — Assessment & Plan Note (Signed)
-   Prescribed diflucan.  - Recommended against using scented products like vagisil.

## 2015-04-30 LAB — CYTOLOGY - PAP

## 2015-05-04 ENCOUNTER — Ambulatory Visit: Payer: Self-pay

## 2015-06-03 ENCOUNTER — Encounter: Payer: Self-pay | Admitting: Family Medicine

## 2015-06-03 ENCOUNTER — Ambulatory Visit (INDEPENDENT_AMBULATORY_CARE_PROVIDER_SITE_OTHER): Payer: Self-pay | Admitting: Family Medicine

## 2015-06-03 VITALS — BP 106/65 | HR 71 | Temp 98.3°F | Wt 135.8 lb

## 2015-06-03 DIAGNOSIS — J069 Acute upper respiratory infection, unspecified: Secondary | ICD-10-CM

## 2015-06-03 MED ORDER — FLUTICASONE PROPIONATE 50 MCG/ACT NA SUSP: 2.0000 | Freq: Every day | NASAL | Status: DC

## 2015-06-03 MED ORDER — GUAIFENESIN-CODEINE 100-10 MG/5ML PO SOLN: 5.0000 mL | Freq: Four times a day (QID) | ORAL | Status: DC | PRN

## 2015-06-03 NOTE — Patient Instructions (Signed)
Fue Curator.  Si tiene alguna pregunta, por favor pngase en contacto conmigo [phone] .  Porque tom antibiticos, no estoy seguro si usted tiene una infeccin bacteriana. Su examen y la historia parece ms consistente con un resfriado. Le prescribo una medicina de la tos y un aerosol nasal para ayudar con la congestin y el rhinorhea. Si usted desarrolla fiebres altas, escalofros, empeoramiento de los sntomas, por favor regrese para su evaluacin. No tome el jarabe para la tos si est manejando o va a operar maquinaria pesada.  Infeccin del tracto respiratorio superior, adultos (Upper Respiratory Infection, Adult) La mayora de las infecciones del tracto respiratorio superior estn causadas por un virus. Un infeccin del tracto respiratorio superior afecta la nariz, la garganta y las vas respiratorias superiores. El tipo ms comn de infeccin del tracto respiratorio superior es el resfro comn. CUIDADOS EN EL HOGAR   Tome los medicamentos solamente como se lo haya indicado el mdico.  A fin de Engineer, materials de garganta, haga grgaras con solucin salina templada o consuma caramelos para la tos, como se lo haya indicado el mdico.  Use un humidificador de vapor clido o inhale el vapor de la ducha para aumentar la humedad del aire. Esto facilitar la respiracin.  Beba suficiente lquido para mantener el pis (orina) claro o de color amarillo plido.  Tome sopas y caldos transparentes.  Siga una dieta saludable.  Descanse todo lo que sea necesario.  Regrese al Aleen Campi cuando la fiebre haya desaparecido o el mdico le diga que puede [place].  Es posible que deba quedarse en su casa durante un tiempo prolongado, para no transmitir la infeccin a los dems.  Tambin puede usar un barbijo y lavarse las manos con frecuencia para evitar el contagio del virus.  Si tiene asma, use el inhalador con mayor frecuencia.  No consuma ningn producto que contenga tabaco, lo  que incluye cigarrillos, tabaco de Theatre manager o Administrator, Civil Service. Si necesita ayuda para dejar de fumar, consulte al mdico. SOLICITE AYUDA SI:  Siente que empeora o que no mejora.  Los medicamentos no logran [employer].  Tiene escalofros.  La dificultad para Clinical cytogeneticist.  Tiene mucosidad marrn o roja.  Tiene una secrecin amarilla o marrn de la Clinical cytogeneticist.  Le duele la cara, especialmente al inclinarse hacia adelante.  Tiene fiebre.  Tiene los ganglios del cuello hinchados.  Siente dolor al tragar.  Tiene zonas blancas en la parte de atrs de la garganta. SOLICITE AYUDA DE INMEDIATO SI:   Los siguientes sntomas son muy intensos o constantes:  Dolor de [place].  Dolor de odos.  Dolor en la frente, detrs de los ojos y por encima de los pmulos (dolor sinusal).  Dolor en el pecho.  Tiene enfermedad pulmonar prolongada (crnica) y cualquiera de estos sntomas:  Sibilancias.  Tos prolongada.  Tos con sangre.  Cambio en la mucosidad habitual.  Presenta rigidez en el cuello.  Tiene cambios en:  La visin.  La audicin.  El pensamiento.  El [place] de nimo. ASEGRESE DE QUE:   Comprende estas instrucciones.  Controlar su afeccin.  Recibir ayuda de inmediato si no mejora o si empeora.   Esta informacin no tiene Theme park manager el consejo del mdico. Asegrese de hacerle al mdico cualquier pregunta que tenga.   Document Released: 08/30/2010 Document Revised: 08/12/2014 Elsevier Interactive Patient Education [employer]

## 2015-06-03 NOTE — Progress Notes (Signed)
Patient ID: Traci Porter, female   DOB: 1991/03/26, 25 y.o.   MRN: [id number]   Subjective:  This history was provided by the patient.  Traci Porter is a 25 y.o. female who  has a past medical history of chronic constipation and infertility associated with anovulation.  Patient presents to clinic for sore throat.  SORE THROAT  Sore throat began 10-14 days ago.  Patient states the pain is improving, but still there.  Patient endorses bad breath with sore throat.   Pain is: constant burning pain worse when swallowing Severity: 5/10 (worse last week) Medications tried: 3 days of Amoxicillin 500 mg once a day that someone sent her from her home country.  Last dose Friday, 2/18.  No Tylenol, ibuprofen or throat lozenges. Strep throat exposure: None STD exposure: None  Symptoms Fever: Last week subjective fever Cough: Dry cough Headache: Yes Runny nose: nasal drainage/congestion, post nasal drip Muscle aches: Yes Swollen Glands: no Trouble breathing: no Drooling: No Weight loss: No Rash:  No N/V/D:  No Patient denies sick contacts.    Patient believes could be caused by: unsure  Review of Systems:  Per HPI. All other systems reviewed and are negative.   PMH, PSH, Medications, Allergies, and FmHx reviewed and updated in EMR.  Social History: never smoker  Objective:  BP 106/65 mmHg  Pulse 71  Temp(Src) 98.3 F (36.8 C) (Oral)  Wt 135 lb 12.8 oz (61.598 kg)  LMP 03/31/2015  General: 25 y.o. female Awake, alert, pleasant, well- nourished, no apparent distress and non-toxic in appearance HEENT: Normal    Neck: No masses palpated. No LAD    Ears: TMs intact, dull.  Erythema bilateral canals, no bulging, no otorrhea.    Eyes: Sclera white with no injection.  No drainage noted.    Nose: Nasal turbinates moist, mildly inflamed and boggy.  Rhinorrhea noted    Throat: MMM, mild erythema, cobblestoning noted.  No edema, no exudate noted. Cardio: RRR, S1S2 heard, no  murmurs appreciated; +2 radial pulses bilaterally Pulm: Clear to auscultation bilaterally, no wheezes, rhonchi or rales GI: Soft, not tender, no distension,+BS x4, no hepatomegaly, no splenomegaly Extremities: MAE MSK: Normal gait and station Skin: dry, intact, no rashes or lesions Neuro: Strength and sensation grossly intact  Assessment & Plan:  Traci Porter is a 25 y.o. female here for 1-2 weeks of sore throat with non-productive cough, nasal congestion/drainage and subjective fever.  Patient is well appearing and in no distress.  She went to work today and has not missed work due to illness.  Although patient treated illness with an antibiotic, current illness more suggestive of viral illness.  She has not treated illness symptomatically.  Will prescribe Flonase for nasal congestion and Guaifenesin with codeine for cough at night.  Discussed precautions for using that medication with patient (don't drive or operate heavy machinery). Patient to use Tylenol and ibuprofen for myalgias and fever.    Discussed return precautions with patient (difficulty breathing or swallowing, fever greater 100.4 not responsive to Tylenol or ibuprofen).    1. Upper respiratory infection with cough and congestion - fluticasone (FLONASE) 50 MCG/ACT nasal spray; Place 2 sprays into both nostrils daily.  Dispense: 16 g; Refill: 0 - guaiFENesin-codeine 100-10 MG/5ML syrup; Take 5 mLs by mouth every 6 (six) hours as needed for cough.  Dispense: 120 mL; Refill: 0  Nelly Rout, NP [facility] 06/03/2015 4:18 PM  I have separately seen and examined the patient. I  have discussed the findings and exam with Student Nurse Practitioner Manson Passey and agree with the above note.  My changes/additions are outlined in BLUE.   1. Upper respiratory infection with cough and congestion.  Reported some myalgia but has been ongoing for several weeks so outside of the window for flu meds.  More likely a viral URI.   Afebrile here.  No evidence of bacterial infection on exam.  Though recently took a 3 day course of Amox.   - Supportive care recommended - fluticasone (FLONASE) 50 MCG/ACT nasal spray; Place 2 sprays into both nostrils daily.  Dispense: 16 g; Refill: 0 - guaiFENesin-codeine 100-10 MG/5ML syrup; Take 5 mLs by mouth every 6 (six) hours as needed for cough.  Dispense: 120 mL; Refill: 0 - Cautioned sedation - May use Tylenol/ Motrin for headache or body ache. - recommended against using Abx not prescribed to her - Return precautions reviewed - Follow up with PCP if no improvement or as scheduled for routine care.  Ashly M. Nadine Counts, DO PGY-2, [facility]

## 2015-07-01 ENCOUNTER — Encounter: Payer: Self-pay | Admitting: Family Medicine

## 2015-07-01 ENCOUNTER — Ambulatory Visit: Payer: Self-pay | Admitting: Family Medicine

## 2015-07-01 ENCOUNTER — Ambulatory Visit (INDEPENDENT_AMBULATORY_CARE_PROVIDER_SITE_OTHER): Payer: Self-pay | Admitting: Family Medicine

## 2015-07-01 VITALS — BP 107/71 | HR 69 | Temp 98.6°F | Wt 137.2 lb

## 2015-07-01 DIAGNOSIS — R1013 Epigastric pain: Secondary | ICD-10-CM

## 2015-07-01 DIAGNOSIS — R0789 Other chest pain: Secondary | ICD-10-CM

## 2015-07-01 LAB — POCT H PYLORI SCREEN: H PYLORI SCREEN, POC: POSITIVE

## 2015-07-01 MED ORDER — AMOXICILLIN 500 MG PO CAPS: 1000.0000 mg | ORAL_CAPSULE | Freq: Two times a day (BID) | ORAL | Status: DC

## 2015-07-01 MED ORDER — OMEPRAZOLE 20 MG PO TBEC: 20.0000 mg | DELAYED_RELEASE_TABLET | Freq: Two times a day (BID) | ORAL | Status: DC

## 2015-07-01 MED ORDER — CLARITHROMYCIN 500 MG PO TABS: 500.0000 mg | ORAL_TABLET | Freq: Two times a day (BID) | ORAL | Status: DC

## 2015-07-01 NOTE — Progress Notes (Signed)
   Subjective:    Patient ID: Traci Porter, female    DOB: [birth year], 25 y.o.   MRN: [id number]  HPI  Patient presents for Same Day Appointment  CC: stomach pain, chest pain  # Stomach and chest pain:  Started about 1 week ago, rather quickly  Not sure if pain was in the stomach first, or chest  Pain is constant, gets worse at times but she can't think of what exactly causes it to get worse. Does not relate it to position changes or any food/eating (later says twisting her upper body and taking deep breaths can make it worse)  Pain sometimes wakes her up at night, she also says she notices some blood tinge to her sputum in the morning  Describes pain as a burning sensation.  Does not specifically say it feels like heartburn, but has never really had that in the past  Has had some cough  Pain in chest is located on the left side, near sternum, hurts when she pushes against it, does not get worse with activity or exertion  No reported family history of cardiac disease or death  Has not tried taking anything for the pain ROS: no fevers, no chills, no nausea or vomiting, +constipation  Social Hx: never smoker  Review of Systems   See HPI for ROS.   Past medical history, surgical, family, and social history reviewed and updated in the EMR as appropriate.  Objective:  BP 107/71 mmHg  Pulse 69  Temp(Src) 98.6 F (37 C) (Oral)  Wt 137 lb 3.2 oz (62.234 kg)  LMP 03/31/2015 (Exact Date) Vitals and nursing note reviewed  General: no apparent distress  CV: normal rate, regular rhythm, no murmurs, rubs or gallop. Chest wall: tender to palpation left sternal border over costosternal joints 3-4. No rashes Resp: clear to auscultation bilaterally, normal effort Abdomen: soft, mildly tender epigastrium, no rebound or guarding, no hepatomegaly, negative Murphy sign. Normal bowel sounds.    Assessment & Plan:   1. Abdominal pain, epigastric Favoring 2 different  etiologies of pain. Costochondritis most consistent with chest wall pain, low suspicion for cardiac etiology in an otherwise healthy 24yo with no significant family history. Abdominal pain was somewhat consistent with ulcerative/GERD, and h pylori screen was positive. Will elect to treat with triple therapy, follow up in 2 weeks if not improving. Recommended tylenol for chest wall pain and avoid NSAIDs (which would be the typical treatment for costochondritis). - H.pylori screen, POC

## 2015-07-01 NOTE — Patient Instructions (Signed)
Take the antibiotics for 14 days  Take the Omeprazole (acid medicine) twice a day for 30 days.   Come back if not improving after 14 days   Tylenol 500-1000mg  every 4-6 hours, no more than 4000mg  in a 24 hour period

## 2015-07-06 ENCOUNTER — Telehealth: Payer: Self-pay | Admitting: Family Medicine

## 2015-07-06 DIAGNOSIS — Z0000 Encounter for general adult medical examination without abnormal findings: Secondary | ICD-10-CM

## 2015-07-06 NOTE — Telephone Encounter (Signed)
Referral placed in EPIC 

## 2015-07-06 NOTE — Telephone Encounter (Signed)
Patient have an orange card and is requesting a referral to the Dental Clinic.

## 2015-09-04 ENCOUNTER — Ambulatory Visit (INDEPENDENT_AMBULATORY_CARE_PROVIDER_SITE_OTHER): Payer: Self-pay | Admitting: Family Medicine

## 2015-09-04 ENCOUNTER — Encounter: Payer: Self-pay | Admitting: Family Medicine

## 2015-09-04 VITALS — BP 114/80 | HR 71 | Temp 98.5°F | Wt 135.0 lb

## 2015-09-04 DIAGNOSIS — A048 Other specified bacterial intestinal infections: Secondary | ICD-10-CM | POA: Insufficient documentation

## 2015-09-04 DIAGNOSIS — B9681 Helicobacter pylori [H. pylori] as the cause of diseases classified elsewhere: Secondary | ICD-10-CM

## 2015-09-04 MED ORDER — AMOXICILLIN 500 MG PO CAPS: 1000.0000 mg | ORAL_CAPSULE | Freq: Two times a day (BID) | ORAL | Status: DC

## 2015-09-04 MED ORDER — LEVOFLOXACIN 500 MG PO TABS: 500.0000 mg | ORAL_TABLET | Freq: Every day | ORAL | Status: DC

## 2015-09-04 MED ORDER — OMEPRAZOLE 20 MG PO TBEC: 20.0000 mg | DELAYED_RELEASE_TABLET | Freq: Two times a day (BID) | ORAL | Status: DC

## 2015-09-04 NOTE — Assessment & Plan Note (Signed)
Most likely resistant H. pylori infection and treatment failure.  She reports compliance with first treatment regimen.  - We'll treat with salvage therapy with 2 weeks of: Levofloxacin 500 mg daily, amoxicillin 1 g twice a day, omeprazole 20 mg twice a day - See AVS for return precautions - Continues to have residual symptoms or return of symptoms after treatment would recommend referral to gastroenterology for endoscopy for biopsy and culture - Advise returning in 6 weeks, 4 weeks after completing therapy, if asymptomatic for H. Pylori retest

## 2015-09-04 NOTE — Progress Notes (Signed)
   Subjective:    Patient ID: Traci Porter, female    DOB: 03-03-1991, 25 y.o.   MRN: [id number]  Seen for Same day visit for   CC: epigastric pain  She comes in today for continued epigastric pain.  She was diagnosed with H. pylori infection and treated with amoxicillin, clarithromycin and omeprazole.  She reports pain completely resolved with this treatment, but then returned within a few days, and has progressively gotten worse again.  She endorses epigastric burning that is worse with foods and lying down.  She denies any chest pain, shortness of breath, palpitations, vomiting, blood in stool or melena.  No fevers or chills.  She reports taking all 14 days of antibiotics and not missing doses.   Smoking history noted  Review of Systems   See HPI for ROS. Objective:  BP 114/80 mmHg  Pulse 71  Temp(Src) 98.5 F (36.9 C) (Oral)  Wt 135 lb (61.236 kg)  General: NAD Cardiac: RRR, normal heart sounds, no murmurs. 2+ radial and PT pulses bilaterally Respiratory: CTAB, normal effort Abdomen: soft, Mild epigastric tenderness, nondistended, no hepatic or splenomegaly. Bowel sounds present    Assessment & Plan:   H. pylori infection Most likely resistant H. pylori infection and treatment failure.  She reports compliance with first treatment regimen.  - We'll treat with salvage therapy with 2 weeks of: Levofloxacin 500 mg daily, amoxicillin 1 g twice a day, omeprazole 20 mg twice a day - See AVS for return precautions - Continues to have residual symptoms or return of symptoms after treatment would recommend referral to gastroenterology for endoscopy for biopsy and culture - Advise returning in 6 weeks, 4 weeks after completing therapy, if asymptomatic for H. Pylori retest

## 2015-09-04 NOTE — Patient Instructions (Signed)
Your continued abdominal pain is most likely due to persistent H. pylori infection.  For the next 2 weeks take: - Continue omeprazole 20 mg twice a day  - Amoxicillin 1000 mg twice a day - Levofloxacin 500 mg once a day  Call or return to clinic if you develop any fevers, vomiting with blood, blood in stool, shortness of breath  - Return to clinic if your symptoms return after completing the treatment  - Return to clinic 4 weeks after completing treatment, if your symptoms do not return

## 2015-10-21 ENCOUNTER — Encounter (HOSPITAL_COMMUNITY): Payer: Self-pay | Admitting: *Deleted

## 2015-10-21 ENCOUNTER — Ambulatory Visit (HOSPITAL_COMMUNITY)
Admission: EM | Admit: 2015-10-21 | Discharge: 2015-10-21 | Disposition: A | Discharge status: Home / Self Care | Payer: Self-pay | Attending: Physician Assistant | Admitting: Physician Assistant

## 2015-10-21 DIAGNOSIS — K011 Impacted teeth: Secondary | ICD-10-CM

## 2015-10-21 MED ORDER — HYDROCODONE-ACETAMINOPHEN 5-325 MG PO TABS: 2.0000 | ORAL_TABLET | ORAL | Status: DC | PRN

## 2015-10-21 MED ORDER — AMOXICILLIN 500 MG PO CAPS: 500.0000 mg | ORAL_CAPSULE | Freq: Three times a day (TID) | ORAL | Status: DC

## 2015-10-21 NOTE — ED Notes (Signed)
Pt  Reports   Pain   r  Side  Of  Face       With    r  Lower  Toothache        X   3  Days             Pt   States  Has  Not  Seen a  Dentist      And the  Symptoms  Are not releived  By  otc  Tylenol

## 2015-10-21 NOTE — ED Provider Notes (Signed)
CSN: [id number]     Arrival date & time 10/21/15  1000 History   None    Chief Complaint  Patient presents with  . Facial Pain   (Consider location/radiation/quality/duration/timing/severity/associated sxs/prior Treatment) HPI History obtained from patient: Location: mouth  Context/Duration: 2 weeks of dental pain, getting worse, gradual onset  Severity: 4  Quality:ache Timing:           Episodic, now constant Home Treatment: OTC meds without relief Associated symptoms:  Painful to chew on right side Family History:    Past Medical History  Diagnosis Date  . Chronic constipation   . Infertility associated with anovulation    Past Surgical History  Procedure Laterality Date  . Implanon removed  03/2009   History reviewed. No pertinent family history. Social History  Substance Use Topics  . Smoking status: Never Smoker   . Smokeless tobacco: Never Used  . Alcohol Use: 0.5 oz/week    1 drink(s) per week   OB History    No data available     Review of Systems  Denies: HEADACHE, NAUSEA, ABDOMINAL PAIN, CHEST PAIN, CONGESTION, DYSURIA, SHORTNESS OF BREATH  Allergies  Review of patient's allergies indicates no known allergies.  Home Medications   Prior to Admission medications   Medication Sig Start Date End Date Taking? Authorizing Provider  amoxicillin (AMOXIL) 500 MG capsule Take 2 capsules (1,000 mg total) by mouth 2 (two) times daily. 09/04/15   Jamal CollinJames R Joyner, MD  amoxicillin (AMOXIL) 500 MG capsule Take 1 capsule (500 mg total) by mouth 3 (three) times daily. 10/21/15   Tharon AquasFrank C Kameren Pargas, PA  HYDROcodone-acetaminophen (NORCO/VICODIN) 5-325 MG tablet Take 2 tablets by mouth every 4 (four) hours as needed. 10/21/15   Tharon AquasFrank C Nadira Single, PA  levofloxacin (LEVAQUIN) 500 MG tablet Take 1 tablet (500 mg total) by mouth daily. 09/04/15   Jamal CollinJames R Joyner, MD  Omeprazole 20 MG TBEC Take 1 tablet (20 mg total) by mouth 2 (two) times daily. 09/04/15   Jamal CollinJames R Joyner, MD   Meds  Ordered and Administered this Visit  Medications - No data to display  BP 107/75 mmHg  Pulse 74  Temp(Src) 98.3 F (36.8 C) (Oral)  Resp 16  SpO2 100%  LMP 09/18/2015 No data found.   Physical Exam NURSES NOTES AND VITAL SIGNS REVIEWED. CONSTITUTIONAL: Well developed, well nourished, no acute distress HEENT: normocephalic, atraumatic, MOUTH: 3rd molar right lower jaw is tender impacted.no palpable or visible abscess. EYES: Conjunctiva normal NECK:normal ROM, supple, no adenopathy PULMONARY:No respiratory distress, normal effort ABDOMINAL: Soft, ND, NT BS+, No CVAT MUSCULOSKELETAL: Normal ROM of all extremities,  SKIN: warm and dry without rash PSYCHIATRIC: Mood and affect, behavior are normal  ED Course  Procedures (including critical care time)  Labs Review Labs Reviewed - No data to display  Imaging Review No results found.   Visual Acuity Review  Right Eye Distance:   Left Eye Distance:   Bilateral Distance:    Right Eye Near:   Left Eye Near:    Bilateral Near:      rx amoxil and norco Return to work note provided   MDM   1. Impacted third molar tooth     Patient is reassured that there are no issues that require transfer to higher level of care at this time or additional tests. Patient is advised to continue home symptomatic treatment. Patient is advised that if there are new or worsening symptoms to attend the emergency department, contact primary care provider,  or return to UC. Instructions of care provided discharged home in stable condition.    THIS NOTE WAS GENERATED USING A VOICE RECOGNITION SOFTWARE PROGRAM. ALL REASONABLE EFFORTS  WERE MADE TO PROOFREAD THIS DOCUMENT FOR ACCURACY.  I have verbally reviewed the discharge instructions with the patient. A printed AVS was given to the patient.  All questions were answered prior to discharge.      Tharon Aquas, PA 10/21/15 1045

## 2015-10-21 NOTE — Discharge Instructions (Signed)
Dental Extraction A dental extraction is the removal (extraction) of a tooth. You may need to have this procedure if:  You have tooth decay.  You have gum disease.  You have an infection (abscess).  Room needs to be made for other teeth to grow in or to be lined up properly.  Baby teeth are stopping your adult teeth from coming to the surface (erupting).  You have a tooth fracture or fractures that cannot be fixed.  You are going to get radiation to your head and neck. The type and length of procedure that you have depends on:  Why you are having a tooth removed.  Where the tooth is located that is being removed. The procedure may be:  A simple extraction. This is done if you can see the tooth in your mouth and it is above the gumline.  A surgical extraction. This is done if the tooth has not come into the mouth or if the tooth is broken off below the gumline. BEFORE THE PROCEDURE  Ask your doctor about:  Changing or stopping your regular medicines. This is important if you take diabetes medicines or blood thinners.  Taking medicines such as aspirin and ibuprofen. These medicines can thin your blood. Do not take these medicines before your procedure if your doctor tells you not to.  Take medicines only as told by your doctor. This includes antibiotic medicines.  Ask your doctor if it is okay to eat or drink before your procedure.  Plan to have someone take you home after the procedure.  If you go home right after the procedure, plan to have someone with you for 24 hours. PROCEDURE  You may be given one or more of these:  A medicine that helps you relax (sedative).  A medicine that numbs the area (local anesthetic).  A medicine that makes you fall asleep (general anesthetic).  If you are having a simple extraction:  Your dentist will loosen the tooth. This is done with a tool called an elevator.  Your dentist will grab the tooth and remove it from the socket.  This is done with a tool called forceps.  The open socket will be cleaned.  Gauze will be placed in the socket. This helps with bleeding.  If you are having a surgical extraction:  Your dentist will make a cut (incision) in the gum.  Some of the bone around the tooth may need to be removed.  The tooth will be removed.  Stitches (sutures) may be needed to close the area. The procedure may vary among doctors and hospitals.  AFTER THE PROCEDURE  You may have gauze in your mouth. If told by your doctor, gently press on the gauze for as long as one hour after the procedure.  A blood clot should start to form over the open socket. This is normal. Do not touch the area, and do not rinse it.  You may be given medicines to help with:  Pain.  Healing.   This information is not intended to replace advice given to you by your health care provider. Make sure you discuss any questions you have with your health care provider.   Document Released: 08/12/2014 Document Reviewed: 08/12/2014 Elsevier Interactive Patient Education 2016 Elsevier Inc. Dental Pain Dental pain may be caused by many things, including:  Tooth decay (cavities or caries). Cavities cause the nerve of your tooth to be open to air and hot or cold temperatures. This can cause pain or discomfort.  Abscess or infection. A dental abscess is an area that is full of infected pus from a bacterial infection in the inner part of the tooth (pulp). It usually happens at the end of the tooth's root.  Injury.  An unknown reason (idiopathic). Your pain may be mild or severe. It may only happen when:  You are chewing.  You are exposed to hot or cold temperature.  You are eating or drinking sugary foods or beverages, such as:  Soda.  Candy. Your pain may also be there all of the time. HOME CARE Watch your dental pain for any changes. Do these things to lessen your discomfort:  Take medicines only as told by your  dentist.  If your dentist tells you to take an antibiotic medicine, finish all of it even if you start to feel better.  Keep all follow-up visits as told by your dentist. This is important.  Do not apply heat to the outside of your face.  Rinse your mouth or gargle with salt water if told by your dentist. This helps with pain and swelling.  You can make salt water by adding  tsp of salt to 1 cup of warm water.  Apply ice to the painful area of your face:  Put ice in a plastic bag.  Place a towel between your skin and the bag.  Leave the ice on for 20 minutes, 2-3 times per day.  Avoid foods or drinks that cause you pain, such as:  Very hot or very cold foods or drinks.  Sweet or sugary foods or drinks. GET HELP IF:  Your pain is not helped with medicines.  Your symptoms are worse.  You have new symptoms. GET HELP RIGHT AWAY IF:  You cannot open your mouth.  You are having trouble breathing or swallowing.  You have a fever.  Your face, neck, or jaw is puffy (swollen).   This information is not intended to replace advice given to you by your health care provider. Make sure you discuss any questions you have with your health care provider.   Document Released: 09/14/2007 Document Revised: 08/12/2014 Document Reviewed: 03/24/2014 Elsevier Interactive Patient Education [employer]2016 Elsevier Inc.

## 2015-10-22 ENCOUNTER — Encounter: Payer: Self-pay | Admitting: Student

## 2015-10-22 ENCOUNTER — Ambulatory Visit (INDEPENDENT_AMBULATORY_CARE_PROVIDER_SITE_OTHER): Payer: Self-pay | Admitting: Student

## 2015-10-22 VITALS — BP 109/66 | HR 84 | Temp 98.3°F | Wt 141.0 lb

## 2015-10-22 DIAGNOSIS — K0889 Other specified disorders of teeth and supporting structures: Secondary | ICD-10-CM | POA: Insufficient documentation

## 2015-10-22 MED ORDER — TRAMADOL HCL 50 MG PO TABS: 50.0000 mg | ORAL_TABLET | Freq: Three times a day (TID) | ORAL | Status: DC | PRN

## 2015-10-22 NOTE — Assessment & Plan Note (Signed)
Toothache with no evidence of complicated tooth infection - Tramadol #50 for pain - Patient counseled to keep appointment for dentist - Return precautions discussed

## 2015-10-22 NOTE — Progress Notes (Signed)
   Subjective:    Patient ID: Traci Porter, female    DOB: 06-04-1990, 25 y.o.   MRN: [id number]  Spanish video interpreter used CC: tooth ache  HPI: [id number] showed female presenting for toothache  Toothache - She was seen on 7/12 for toothache and prescribed Amoxil and Percocet #10 for impacted molar - Since then she has continued to have pain - Percocet helps but only last for 4 hours, she has 3 left - She presents for more pain medications - She has been using cold packs and felt rinses. Motrin and Tylenol do not help - Denies fevers, it hurts to eat, but has been able to tolerate by mouth - Has an appointment with the dentist on 7/17  Smoking status reviewed  Review of Systems  Per HPI, else denies recent illness, fever, headache, changes in vision, chest pain, shortness of breath, abdominal pain, N/V/D    Objective:  BP 109/66 mmHg  Pulse 84  Temp(Src) 98.3 F (36.8 C) (Oral)  Wt 141 lb (63.957 kg)  SpO2 99%  LMP 09/18/2015 Vitals and nursing note reviewed  General: NAD HEENT: Jaw adjacent to right third molar tender to touch, no visible abscess, no jaw induration, or erythema, no cervical lymphadenopathy Cardiac: RRR Respiratory: CTAB, normal effort Skin: warm and dry, no rashes noted Neuro: alert and oriented   Assessment & Plan:    Tooth ache Toothache with no evidence of complicated tooth infection - Tramadol #50 for pain - Patient counseled to keep appointment for dentist - Return precautions discussed     Traci Porter A. Kennon RoundsHaney MD, MS Family Medicine Resident PGY-2 Pager [phone]

## 2015-10-22 NOTE — Patient Instructions (Signed)
Please keep your dentist appointment on 7/17 } If you start to have fevers, or worsening pain return to the clinic or go to the ED If you have questions or concerns, call the office at [phone]

## 2015-11-19 ENCOUNTER — Ambulatory Visit: Payer: Self-pay

## 2015-12-11 ENCOUNTER — Ambulatory Visit (INDEPENDENT_AMBULATORY_CARE_PROVIDER_SITE_OTHER): Payer: Self-pay | Admitting: Student

## 2015-12-11 ENCOUNTER — Encounter: Payer: Self-pay | Admitting: Student

## 2015-12-11 ENCOUNTER — Other Ambulatory Visit (HOSPITAL_COMMUNITY)
Admission: RE | Admit: 2015-12-11 | Discharge: 2015-12-11 | Disposition: A | Discharge status: Home / Self Care | Payer: Self-pay | Source: Ambulatory Visit | Attending: Family Medicine | Admitting: Family Medicine

## 2015-12-11 VITALS — BP 126/72 | HR 70 | Temp 98.3°F | Wt 147.0 lb

## 2015-12-11 DIAGNOSIS — B9681 Helicobacter pylori [H. pylori] as the cause of diseases classified elsewhere: Secondary | ICD-10-CM

## 2015-12-11 DIAGNOSIS — N898 Other specified noninflammatory disorders of vagina: Secondary | ICD-10-CM

## 2015-12-11 DIAGNOSIS — Z113 Encounter for screening for infections with a predominantly sexual mode of transmission: Secondary | ICD-10-CM | POA: Insufficient documentation

## 2015-12-11 DIAGNOSIS — A048 Other specified bacterial intestinal infections: Secondary | ICD-10-CM

## 2015-12-11 DIAGNOSIS — N926 Irregular menstruation, unspecified: Secondary | ICD-10-CM

## 2015-12-11 DIAGNOSIS — N912 Amenorrhea, unspecified: Secondary | ICD-10-CM

## 2015-12-11 LAB — POCT WET PREP (WET MOUNT)
CLUE CELLS WET PREP WHIFF POC: NEGATIVE
TRICHOMONAS WET PREP HPF POC: ABSENT

## 2015-12-11 LAB — POCT URINE PREGNANCY: Preg Test, Ur: NEGATIVE

## 2015-12-11 MED ORDER — DOCUSATE SODIUM 100 MG PO CAPS: 100.0000 mg | ORAL_CAPSULE | Freq: Two times a day (BID) | ORAL | 2 refills | Status: DC

## 2015-12-11 NOTE — Assessment & Plan Note (Signed)
Colace prescribed - will follow

## 2015-12-11 NOTE — Assessment & Plan Note (Signed)
Discussed condoms with each sexual episode if she does not wish pregnancy. Will follow up to discuss contraceptions if she decides to pursue this

## 2015-12-11 NOTE — Patient Instructions (Addendum)
You had a test today to confirm whether your H pylori infection has resolved. He recalls if your tests is positive and if it is able to do to gastroenterology for evaluation You had a pregnancy test today which was negative. Please use condoms with every episode of intercourse if you're not planning pregnant. Please return to discuss contraception in the future as desired If you've questions or concerns call the office at [phone]

## 2015-12-11 NOTE — Progress Notes (Addendum)
   Subjective:    Patient ID: Traci Porter, female    DOB: [birth year], 25 y.o.   MRN: [id number]   CC: Follow-up positive H. pylori, irregular menses   HPI: 25 year old female presenting for follow-up H. pylori infection as well as irregular menses  H. Pylori - Was treated for this on 09/04/2015 - Reports abdominal pain has resolved   Irregular periods - . Today, every 2-3 months - Patient's last menstrual period was 08/15/2015.  - she feels this is longer than her usual irregular menses, had one neg U preg test 3 weeks ago - Reports regular intercourse with one long-term partner - Not currently trying to get pregnant - Denies vaginal irritation, discharge, dysuria  Abd pain/ concernfor constipation - Does report occasional lower abdominal pain however feels this may be related to constipation. Typically has bowel movements every 2-3 days. Last BM yesterday morning  Smoking status reviewed  Review of Systems  Per HPI, else denies recent illness, fever, headache, changes in vision, chest pain, shortness of breath, abdominal pain, N/V/D, weakness    Objective:  BP 126/72   Pulse 70   Temp 98.3 F (36.8 C) (Oral)   Wt 147 lb (66.7 kg)   LMP 08/15/2015   SpO2 100%   BMI 28.71 kg/m  Vitals and nursing note reviewed  General: NAD Cardiac: RRR,  Respiratory: CTAB, normal effort Extremities: no edema or cyanosis.  Skin: warm and dry, no rashes noted Neuro: alert and oriented, no focal deficits  Pelvic: Normal EGBUS, normal vaginal canal, normal cervix with no CMT, normal mobile uterus, normal adnexa with no masses, no adnexal tenderness     Assessment & Plan:    H. pylori infection Resolved symtpoms - Retest Urea breath test today - If positive referred to gastroenterology  MENSTRUAL CYCLE, IRREGULAR Previous pelvic US cocnerning for PCOS, which is very likley given her history of irregular menses - given longer than usual interval between menses, u  preg was performed and was neg  Contraceptive management Discussed condoms with each sexual episode if she does not wish pregnancy. Will follow up to discuss contraceptions if she decides to pursue this  Constipation Colace prescribed - will follow    Milburn Freeney A. Kennon RoundsHaney MD, MS Family Medicine Resident PGY-3 Pager [phone]

## 2015-12-11 NOTE — Assessment & Plan Note (Signed)
Previous pelvic US cocnerning for PCOS, which is very likley given her history of irregular menses - given longer than usual interval between menses, u preg was performed and was neg

## 2015-12-11 NOTE — Assessment & Plan Note (Signed)
Resolved symtpoms - Retest Urea breath test today - If positive referred to gastroenterology

## 2015-12-15 LAB — CERVICOVAGINAL ANCILLARY ONLY
CHLAMYDIA, DNA PROBE: NEGATIVE
NEISSERIA GONORRHEA: NEGATIVE

## 2015-12-15 LAB — H. PYLORI BREATH TEST: H. PYLORI BREATH TEST: NOT DETECTED

## 2015-12-29 ENCOUNTER — Telehealth: Payer: Self-pay | Admitting: Family Medicine

## 2015-12-29 ENCOUNTER — Telehealth: Payer: Self-pay | Admitting: Student

## 2015-12-29 NOTE — Telephone Encounter (Signed)
Would like results from labs done 12-11-15.

## 2015-12-29 NOTE — Telephone Encounter (Signed)
Patient called regarding test results.

## 2015-12-29 NOTE — Telephone Encounter (Signed)
Will forward message to Dr. Kennon RoundsHaney as she saw the patient and ordered the labs.

## 2016-02-03 ENCOUNTER — Ambulatory Visit: Payer: Self-pay | Attending: Internal Medicine

## 2016-02-24 NOTE — Progress Notes (Addendum)
25 y.o. female presents for well woman/preventative visit and annual GYN examination. Video Spanish interpreter used for entire visit.   Acute Concerns: History of Amenorrhea evaluated on 12/11/15 for this issue, pregnancy test negative, normal pelvis US in 2011  Amenorrhea: Patient reports that her last period was in May 2017. She states that she had a home pregnancy test in 01/2016 that was negative. She has been experiencing a constant stabbing pain since July 2017 that is more pronounced during sexual intercourse. The pain fluctuates in intensity with the greatest pain reported 8/10.  Denies pain on urination, foul odor or vaginal discharge. Reports occasional red-brown spotting when she wipes after using the bathroom. Denies profuse vaginal bleeding. Sprintec stopped early 2016.  Skin tan-brown discoloration: Noticed a tan-brown discoloration located to the right of the umbilicus that has been present for about 4 months and that has increased in size. Denies pain or ulceration. Positive for occasional pruritus.   Left back pain: Reports experiencing a constant stabbing back pain. Patient does not recall when it started nor any event that may have caused injury to her back.  No numbness or tingling of bilateral upper and lower extremities. Denies shooting pain down bilateral thighs.   Diet:  Breakfast: Coffee and bread  Lunch: Tortilla with beans and eggs Dinner: Steak or Chicken  Occasional Fruits and vegatables with lunch and dinner: Broccoli, Asparagus, Mango, Papaya  Exercise:  Walking (2 or 3 times per week)  Sexual/Birth History:  Married/Monogamous for 8 years Unprotected sex  Birth Control: No birth control   POA/Living Will:  None. Interested in pamphlet/more information today.   Social:  Social History   Social History  . Marital status: Single    Spouse name: Kelby FamManuel  . Number of children: 1  . Years of education: 5   Social History Main Topics  . Smoking  status: Never Smoker  . Smokeless tobacco: Never Used  . Alcohol use 0.5 oz/week    1 drink(s) per week  . Drug use: No  . Sexual activity: Yes    Partners: Male   Other Topics Concern  . None   Social History Narrative   Native of [place], moved to [place] to join mother, Leonie GreenMaria Hernandez 2004; Catholic faith. Lives with Leslye PeerSon Ivan (07/28/06)    Housewife   Sheppard CoilManuel Garcia   FOB not involved.  She has 5 brothers, one of which was killed by gang in [place] (1/08)   Occupation - works at [employer]                   Immunization: Immunization History  Administered Date(s) Administered  . Influenza Split 12/30/2011  . Influenza Whole 01/20/2009  . Influenza,inj,Quad PF,36+ Mos 01/20/2014, 01/23/2015  . Td 11/30/2009  Due for flu vaccine.  Consented to receive FLU vaccine today  Cancer Screening:  Pap Smear: 04/2015 (negative for intraepithelial lesion)  Mammogram: Not a candidate  Colonoscopy: Not a candidate  Dexa: Not a candidate  TSH 08/2012 normal Pelvic US 2011 was unremarkable  ROS: Gen: Negative for weight loss or fatigue Neuro: Negative for syncope, tremors, numbness, tingling, headache, dizziness Heent: Negative for diplopia, blurry vision, otorrhea, otalgia, rhinorrhea, dysphagia,  CV: Negative for Chest pain, orthopnea, dyspnea Resp: Negative for dyspnea, cough, productive cough, wheezing, stridor GI: Negative for bloody stool, abdominal pain, diarrhea or constipation GU/Renal: Negative for hematuria, dysuria, vaginal discharge or malodor. Positive for suprapubic pain MSK: left back pain Skin: Positive for skin discoloration right of the  umbilicus  Physical Exam: VITALS: Reviewed GEN: Pleasant female, NAD HEENT: Normocephalic, PERRLA, EOMI, no scleral icterus, bilateral TM pearly grey, nasal septum midline, MMM, uvula midline, no anterior or posterior lymphadenopathy, no thyromegaly CARDIAC: RRR, S1 and S2 present, no murmur, no heaves/thrills RESP: Lungs  CTAB, normal work of breathing. ABD: soft, bilateral lower quadrant and suprapubic tenderness present, normal bowel sounds.No rebound or guarding. GU/GYN:Exam performed in the presence of a chaperone. Normal external genitalia. White discharge noted in vaginal orifice and cervix. No malodor or bleeding. Normal appearance of cervix. Bimanual exam identified no masses. Wet prep and cultures obtained.  EXT: No edema, 2+ radial and DP pulses SKIN: 3 x 1 cm tan-brown macule with irregular borders, non-ulcerated located to the right of the umbilicus MSK: Bilateral straight leg test negative. Tender to palpation over left thoracic spine, no midline tenderness.  ASSESSMENT & PLAN: 25 y.o. female presents for annual well woman/preventative exam and GYN exam. Please see problem specific assessment and plan.   Preventative health care 25 y/o female presents for well woman visit. -flu shot provided, otherwise up to date on immunizations -Up to date on Pap Smear, not a candidate for other cancer screening -Dicussed healthy eating and regular exercise  Thoracic back pain Left thoracic back pain. Mostly likely MSK etiology. Also checking UA to rule out UTI/pyelo. -prn heat and Tylenol  MENSTRUAL CYCLE, IRREGULAR Patient has had amenorrhea since May of 2017. Pregnancy test negative. -check TSH, Prolactin  Suprapubic tenderness Bilateral lower quadrant and suprapubic tenderness. Pelvic exam positive for discharge and cervical tenderness. -check transvaginal US to evaluate uterus and ovaries -UA to evaluate for UTI  Vaginal discharge Yellow discharge present on pelvic exam. -wet prep and cultures obtained to rule out infectious process  Rash and nonspecific skin eruption 3 x 1 cm tan-brown macule. Most consistent with tinea infection. -sent terbinafine to pharmacy

## 2016-02-25 ENCOUNTER — Encounter: Payer: Self-pay | Admitting: Family Medicine

## 2016-02-25 ENCOUNTER — Ambulatory Visit (INDEPENDENT_AMBULATORY_CARE_PROVIDER_SITE_OTHER): Payer: Self-pay | Admitting: Family Medicine

## 2016-02-25 ENCOUNTER — Other Ambulatory Visit (HOSPITAL_COMMUNITY)
Admission: RE | Admit: 2016-02-25 | Discharge: 2016-02-25 | Disposition: A | Discharge status: Home / Self Care | Payer: Self-pay | Source: Ambulatory Visit | Attending: Family Medicine | Admitting: Family Medicine

## 2016-02-25 VITALS — BP 125/77 | HR 74 | Temp 98.2°F | Ht 60.0 in | Wt 153.8 lb

## 2016-02-25 DIAGNOSIS — Z0000 Encounter for general adult medical examination without abnormal findings: Secondary | ICD-10-CM

## 2016-02-25 DIAGNOSIS — N898 Other specified noninflammatory disorders of vagina: Secondary | ICD-10-CM

## 2016-02-25 DIAGNOSIS — R21 Rash and other nonspecific skin eruption: Secondary | ICD-10-CM

## 2016-02-25 DIAGNOSIS — R1013 Epigastric pain: Secondary | ICD-10-CM | POA: Insufficient documentation

## 2016-02-25 DIAGNOSIS — R10819 Abdominal tenderness, unspecified site: Secondary | ICD-10-CM | POA: Insufficient documentation

## 2016-02-25 DIAGNOSIS — Z23 Encounter for immunization: Secondary | ICD-10-CM

## 2016-02-25 DIAGNOSIS — Z113 Encounter for screening for infections with a predominantly sexual mode of transmission: Secondary | ICD-10-CM | POA: Insufficient documentation

## 2016-02-25 DIAGNOSIS — N926 Irregular menstruation, unspecified: Secondary | ICD-10-CM

## 2016-02-25 DIAGNOSIS — M546 Pain in thoracic spine: Secondary | ICD-10-CM | POA: Insufficient documentation

## 2016-02-25 LAB — POCT URINALYSIS DIPSTICK
Bilirubin, UA: NEGATIVE
GLUCOSE UA: NEGATIVE
Ketones, UA: NEGATIVE
NITRITE UA: NEGATIVE
Protein, UA: NEGATIVE
Spec Grav, UA: 1.01
UROBILINOGEN UA: 0.2
pH, UA: 6

## 2016-02-25 LAB — CBC WITH DIFFERENTIAL/PLATELET
BASOS ABS: 77 {cells}/uL (ref 0–200)
Basophils Relative: 1 %
EOS ABS: 1078 {cells}/uL — AB (ref 15–500)
Eosinophils Relative: 14 %
HEMATOCRIT: 43.3 % (ref 35.0–45.0)
HEMOGLOBIN: 14.7 g/dL (ref 11.7–15.5)
LYMPHS ABS: 1925 {cells}/uL (ref 850–3900)
Lymphocytes Relative: 25 %
MCH: 29.8 pg (ref 27.0–33.0)
MCHC: 33.9 g/dL (ref 32.0–36.0)
MCV: 87.8 fL (ref 80.0–100.0)
MONO ABS: 693 {cells}/uL (ref 200–950)
MPV: 9.7 fL (ref 7.5–12.5)
Monocytes Relative: 9 %
NEUTROS PCT: 51 %
Neutro Abs: 3927 cells/uL (ref 1500–7800)
Platelets: 282 10*3/uL (ref 140–400)
RBC: 4.93 MIL/uL (ref 3.80–5.10)
RDW: 14.1 % (ref 11.0–15.0)
WBC: 7.7 10*3/uL (ref 3.8–10.8)

## 2016-02-25 LAB — COMPLETE METABOLIC PANEL WITH GFR
ALBUMIN: 4.4 g/dL (ref 3.6–5.1)
ALK PHOS: 77 U/L (ref 33–115)
ALT: 35 U/L — ABNORMAL HIGH (ref 6–29)
AST: 30 U/L (ref 10–30)
BUN: 10 mg/dL (ref 7–25)
CALCIUM: 9.2 mg/dL (ref 8.6–10.2)
CO2: 26 mmol/L (ref 20–31)
CREATININE: 0.57 mg/dL (ref 0.50–1.10)
Chloride: 103 mmol/L (ref 98–110)
GFR, Est African American: >89 mL/min (ref >=60)
GFR, Est Non African American: >89 mL/min (ref >=60)
Glucose, Bld: 94 mg/dL (ref 65–99)
POTASSIUM: 3.9 mmol/L (ref 3.5–5.3)
Sodium: 139 mmol/L (ref 135–146)
Total Bilirubin: 0.5 mg/dL (ref 0.2–1.2)
Total Protein: 7.7 g/dL (ref 6.1–8.1)

## 2016-02-25 LAB — POCT WET PREP (WET MOUNT)
Clue Cells Wet Prep Whiff POC: NEGATIVE
Trichomonas Wet Prep HPF POC: ABSENT

## 2016-02-25 LAB — POCT UA - MICROSCOPIC ONLY

## 2016-02-25 LAB — TSH: TSH: 1.04 mIU/L

## 2016-02-25 LAB — POCT URINE PREGNANCY: PREG TEST UR: NEGATIVE

## 2016-02-25 MED ORDER — TERBINAFINE HCL 1 % EX CREA: 1.0000 "application " | TOPICAL_CREAM | Freq: Two times a day (BID) | CUTANEOUS | 0 refills | Status: DC

## 2016-02-25 NOTE — Assessment & Plan Note (Signed)
Patient has had amenorrhea since May of 2017. Pregnancy test negative. -check TSH, Prolactin

## 2016-02-25 NOTE — Assessment & Plan Note (Signed)
25 y/o female presents for well woman visit. -flu shot provided, otherwise up to date on immunizations -Up to date on Pap Smear, not a candidate for other cancer screening -Dicussed healthy eating and regular exercise

## 2016-02-25 NOTE — Assessment & Plan Note (Signed)
Yellow discharge present on pelvic exam. -wet prep and cultures obtained to rule out infectious process

## 2016-02-25 NOTE — Patient Instructions (Signed)
It was nice to see you today.  Dr. Randolm IdolFletke will call you with your lab results. He is checking a urine pregnancy test, a test to look for urine infection, and some other labs to check your thyroid and blood levels.   Back pain - likely due to muscle pain, attempt trial of heat to the area, ok to take Tylenol for pain as well.   Flu shot given today.

## 2016-02-25 NOTE — Assessment & Plan Note (Signed)
3 x 1 cm tan-brown macule. Most consistent with tinea infection. -sent terbinafine to pharmacy

## 2016-02-25 NOTE — Assessment & Plan Note (Signed)
Bilateral lower quadrant and suprapubic tenderness. Pelvic exam positive for discharge and cervical tenderness. -check transvaginal US to evaluate uterus and ovaries -UA to evaluate for UTI

## 2016-02-25 NOTE — Addendum Note (Signed)
Addended by: Jennette BillBUSICK, ROBERT L on: 02/25/2016 03:18 PM   Modules accepted: Orders

## 2016-02-25 NOTE — Assessment & Plan Note (Signed)
Left thoracic back pain. Mostly likely MSK etiology. Also checking UA to rule out UTI/pyelo. -prn heat and Tylenol

## 2016-02-26 ENCOUNTER — Encounter: Payer: Self-pay | Admitting: Family Medicine

## 2016-02-26 LAB — CERVICOVAGINAL ANCILLARY ONLY
CHLAMYDIA, DNA PROBE: NEGATIVE
NEISSERIA GONORRHEA: NEGATIVE

## 2016-02-26 LAB — PROLACTIN: PROLACTIN: 5 ng/mL

## 2016-03-01 ENCOUNTER — Ambulatory Visit (HOSPITAL_COMMUNITY)
Admission: RE | Admit: 2016-03-01 | Discharge: 2016-03-01 | Disposition: A | Discharge status: Home / Self Care | Payer: Self-pay | Source: Ambulatory Visit | Attending: Family Medicine | Admitting: Family Medicine

## 2016-03-01 DIAGNOSIS — N912 Amenorrhea, unspecified: Secondary | ICD-10-CM | POA: Insufficient documentation

## 2016-03-01 DIAGNOSIS — N926 Irregular menstruation, unspecified: Secondary | ICD-10-CM | POA: Insufficient documentation

## 2016-03-07 ENCOUNTER — Telehealth: Payer: Self-pay | Admitting: Family Medicine

## 2016-03-07 DIAGNOSIS — E282 Polycystic ovarian syndrome: Secondary | ICD-10-CM

## 2016-03-07 NOTE — Telephone Encounter (Signed)
Pt returned call

## 2016-03-07 NOTE — Telephone Encounter (Signed)
Called patient using [employer] (Spanish). No answer. Left message that I would attempt to call again at later time.

## 2016-03-08 DIAGNOSIS — E282 Polycystic ovarian syndrome: Secondary | ICD-10-CM | POA: Insufficient documentation

## 2016-03-08 HISTORY — DX: Polycystic ovarian syndrome: E28.2

## 2016-03-08 MED ORDER — METFORMIN HCL 500 MG PO TABS: 500.0000 mg | ORAL_TABLET | Freq: Every day | ORAL | 1 refills | Status: DC

## 2016-03-08 NOTE — Telephone Encounter (Signed)
Called patient with ultrasound results. Ultrasound consistent with PCOS. May be causing amenorrhea. Counseled patient on importance of weight loss, regular exercise, and will start metformin 500 mg daily. Return to clinic in 2-3 months.

## 2016-03-08 NOTE — Assessment & Plan Note (Signed)
Ultrasound consistent with PCOS. May be causing amenorrhea. Counseled patient on importance of weight loss, regular exercise, and will start metformin 500 mg daily.

## 2016-03-10 ENCOUNTER — Telehealth: Payer: Self-pay | Admitting: Family Medicine

## 2016-03-10 DIAGNOSIS — K089 Disorder of teeth and supporting structures, unspecified: Secondary | ICD-10-CM

## 2016-03-10 NOTE — Telephone Encounter (Signed)
Pt called and would like a referral to see a dentist. Myriam Jacobsonjw

## 2016-03-10 NOTE — Telephone Encounter (Signed)
Referral placed in EPIC 

## 2016-08-18 ENCOUNTER — Ambulatory Visit (INDEPENDENT_AMBULATORY_CARE_PROVIDER_SITE_OTHER): Payer: Self-pay | Admitting: Family Medicine

## 2016-08-18 ENCOUNTER — Other Ambulatory Visit (HOSPITAL_COMMUNITY)
Admission: RE | Admit: 2016-08-18 | Discharge: 2016-08-18 | Disposition: A | Discharge status: Home / Self Care | Payer: Self-pay | Source: Ambulatory Visit | Attending: Family Medicine | Admitting: Family Medicine

## 2016-08-18 ENCOUNTER — Encounter: Payer: Self-pay | Admitting: Family Medicine

## 2016-08-18 VITALS — BP 124/70 | HR 69 | Temp 98.3°F | Ht 60.0 in | Wt 151.8 lb

## 2016-08-18 DIAGNOSIS — Z113 Encounter for screening for infections with a predominantly sexual mode of transmission: Secondary | ICD-10-CM | POA: Insufficient documentation

## 2016-08-18 DIAGNOSIS — N898 Other specified noninflammatory disorders of vagina: Secondary | ICD-10-CM

## 2016-08-18 LAB — POCT WET PREP (WET MOUNT)
Clue Cells Wet Prep Whiff POC: POSITIVE
TRICHOMONAS WET PREP HPF POC: ABSENT

## 2016-08-18 NOTE — Assessment & Plan Note (Signed)
No discharge noted on speculum exam, only vaginal bleeding Wet prep, GC chlamydia Also screen for STDs with HIV, RPR We will treat as indicated for results Follow-up as needed

## 2016-08-18 NOTE — Progress Notes (Signed)
   Subjective:   Traci Porter is a 26 y.o. female with a history of PMDD, PCO S, infertility here for same day appt for  Chief Complaint  Patient presents with  . Vaginal Discharge    VAGINAL DISCHARGE  Having vaginal discharge for 4 days. Medications tried: none Discharge consistency: thick Discharge color: white Recent antibiotic use: no Sex in last month: yes - 1 female partner Possible STD exposure: no  Symptoms Fever: no Dysuria:no Vaginal bleeding: yes - spotting after menstrual period Abdomen or Pelvic pain: some abd cramping Back pain: no Genital sores or ulcers:no Rash: no Pain during sex: no Missed menstrual period: no  ROS see HPI Smoking Status noted  Objective:  BP 124/70   Pulse 69   Temp 98.3 F (36.8 C) (Oral)   Ht 5' (1.524 m)   Wt 151 lb 12.8 oz (68.9 kg)   LMP 08/05/2016 (Exact Date)   SpO2 98%   BMI 29.65 kg/m   Gen:  26 y.o. female in NAD HEENT: NCAT, MMM, anicteric sclerae CV: Regular rate Resp: Non-labored, CTAB, no wheezes noted Abd: Soft, NTND, BS present, no guarding or organomegaly GYN:  External genitalia within normal limits.  Vaginal mucosa pink, moist, normal rugae.  Nonfriable cervix without lesions, no discharge noted on speculum exam, +bleeding.  Bimanual exam revealed normal, nongravid uterus.  No cervical motion tenderness. No adnexal masses bilaterally.   Ext: WWP, no edema MSK: Full ROM, strength intact Neuro: Alert and oriented, speech normal     Assessment & Plan:     Traci Porter is a 26 y.o. female here for   Vaginal discharge No discharge noted on speculum exam, only vaginal bleeding Wet prep, GC chlamydia Also screen for STDs with HIV, RPR We will treat as indicated for results Follow-up as needed    Erasmo DownerBacigalupo, Tomeko Scoville M, MD MPH PGY-3,  [facility] 08/18/2016  11:39 AM

## 2016-08-18 NOTE — Patient Instructions (Signed)
Vaginitis Vaginitis is an inflammation of the vagina. It is most often caused by a change in the normal balance of the bacteria and yeast that live in the vagina. This change in balance causes an overgrowth of certain bacteria or yeast, which causes the inflammation. There are different types of vaginitis, but the most common types are:  Bacterial vaginosis.  Yeast infection (candidiasis).  Trichomoniasis vaginitis. This is a sexually transmitted infection (STI).  Viral vaginitis.  Atrophic vaginitis.  Allergic vaginitis. What are the causes? The cause depends on the type of vaginitis. Vaginitis can be caused by:  Bacteria (bacterial vaginosis).  Yeast (yeast infection).  A parasite (trichomoniasis vaginitis)  A virus (viral vaginitis).  Low hormone levels (atrophic vaginitis). Low hormone levels can occur during pregnancy, breastfeeding, or after menopause.  Irritants, such as bubble baths, scented tampons, and feminine sprays (allergic vaginitis). Other factors can change the normal balance of the yeast and bacteria that live in the vagina. These include:  Antibiotic medicines.  Poor hygiene.  Diaphragms, vaginal sponges, spermicides, birth control pills, and intrauterine devices (IUD).  Sexual intercourse.  Infection.  Uncontrolled diabetes.  A weakened immune system. What are the signs or symptoms? Symptoms can vary depending on the cause of the vaginitis. Common symptoms include:  Abnormal vaginal discharge.  The discharge is white, gray, or yellow with bacterial vaginosis.  The discharge is thick, white, and cheesy with a yeast infection.  The discharge is frothy and yellow or greenish with trichomoniasis.  A bad vaginal odor.  The odor is fishy with bacterial vaginosis.  Vaginal itching, pain, or swelling.  Painful intercourse.  Pain or burning when urinating. Sometimes there are no symptoms. How is this treated? Treatment will vary depending on  the type of infection.  Bacterial vaginosis and trichomoniasis are often treated with antibiotic creams or pills.  Yeast infections are often treated with antifungal medicines, such as vaginal creams or suppositories.  Viral vaginitis has no cure, but symptoms can be treated with medicines that relieve discomfort. Your sexual partner should be treated as well.  Atrophic vaginitis may be treated with an estrogen cream, pill, suppository, or vaginal ring. If vaginal dryness occurs, lubricants and moisturizing creams may help. You may be told to avoid scented soaps, sprays, or douches.  Allergic vaginitis treatment involves quitting the use of the product that is causing the problem. Vaginal creams can be used to treat the symptoms. Follow these instructions at home:  Take all medicines as directed by your caregiver.  Keep your genital area clean and dry. Avoid soap and only rinse the area with water.  Avoid douching. It can remove the healthy bacteria in the vagina.  Do not use tampons or have sexual intercourse until your vaginitis has been treated. Use sanitary pads while you have vaginitis.  Wipe from front to back. This avoids the spread of bacteria from the rectum to the vagina.  Let air reach your genital area. ? Wear cotton underwear to decrease moisture buildup.  Avoid wearing underwear while you sleep until your vaginitis is gone.  Avoid tight pants and underwear or nylons without a cotton panel.  Take off wet clothing (especially bathing suits) as soon as possible.  Use mild, non-scented products. Avoid using irritants, such as:  Scented feminine sprays.  Fabric softeners.  Scented detergents.  Scented tampons.  Scented soaps or bubble baths.  Practice safe sex and use condoms. Condoms may prevent the spread of trichomoniasis and viral vaginitis. Contact a health care   provider if:  You have abdominal pain.  You have symptoms that last for more than 2-3  days.  You have a fever and your symptoms suddenly get worse. This information is not intended to replace advice given to you by your health care provider. Make sure you discuss any questions you have with your health care provider. Document Released: 01/23/2007 Document Revised: 02/17/2016 Document Reviewed: 02/17/2016 Elsevier Interactive Patient Education  2017 Elsevier Inc.  

## 2016-08-19 ENCOUNTER — Telehealth: Payer: Self-pay | Admitting: Family Medicine

## 2016-08-19 LAB — CERVICOVAGINAL ANCILLARY ONLY
CHLAMYDIA, DNA PROBE: NEGATIVE
Neisseria Gonorrhea: NEGATIVE

## 2016-08-19 LAB — HIV ANTIBODY (ROUTINE TESTING W REFLEX): HIV Screen 4th Generation wRfx: NONREACTIVE

## 2016-08-19 LAB — RPR: RPR: NONREACTIVE

## 2016-08-19 MED ORDER — METRONIDAZOLE 500 MG PO TABS: 500.0000 mg | ORAL_TABLET | Freq: Two times a day (BID) | ORAL | 0 refills | Status: DC

## 2016-08-19 NOTE — Telephone Encounter (Signed)
Labs consistent with bacterial vaginosis (not an STD, just an imbalance in good and bad bacteria).  We will treat with 7 day course of Flagyl.  Do not drink alcohol while taking this medication.  Please let patient know above.  Erasmo DownerBacigalupo, Angela M, MD, MPH PGY-3,  [facility] 08/19/2016 9:47 AM

## 2016-08-19 NOTE — Telephone Encounter (Signed)
Pt informed. Deseree Blount, CMA  

## 2016-08-26 ENCOUNTER — Telehealth: Payer: Self-pay | Admitting: Family Medicine

## 2016-08-26 NOTE — Telephone Encounter (Signed)
Pt returned phone call.  

## 2016-08-26 NOTE — Telephone Encounter (Signed)
Left message on voicemail for patient to call back with more details. 

## 2016-08-26 NOTE — Telephone Encounter (Signed)
Patient calls after hours line.  She states that she finished her course of Flagyl and wants to know if she can have sexual intercourse or if BV is and STD.  Described what BV is and that it is safe to have sexual intercourse.  Erasmo DownerBacigalupo, Trinity Haun M, MD, MPH PGY-3,  [facility] 08/26/2016 6:36 PM

## 2016-08-26 NOTE — Telephone Encounter (Signed)
Called again x 2 and received busy signal.

## 2016-08-26 NOTE — Telephone Encounter (Signed)
Pt saw Dr. Leonard SchwartzB on the 10th and has finished her medication. Pt has a question for Dr. B, pt would not state the question, but would like Dr. B to call her. ep

## 2016-09-01 ENCOUNTER — Ambulatory Visit: Payer: Self-pay

## 2016-10-21 ENCOUNTER — Encounter: Payer: Self-pay | Admitting: Family Medicine

## 2016-10-21 ENCOUNTER — Ambulatory Visit (INDEPENDENT_AMBULATORY_CARE_PROVIDER_SITE_OTHER): Payer: Self-pay | Admitting: Family Medicine

## 2016-10-21 VITALS — BP 110/72 | HR 69 | Temp 98.3°F | Ht 60.0 in | Wt 149.0 lb

## 2016-10-21 DIAGNOSIS — N760 Acute vaginitis: Secondary | ICD-10-CM

## 2016-10-21 DIAGNOSIS — N898 Other specified noninflammatory disorders of vagina: Secondary | ICD-10-CM

## 2016-10-21 DIAGNOSIS — N939 Abnormal uterine and vaginal bleeding, unspecified: Secondary | ICD-10-CM

## 2016-10-21 DIAGNOSIS — B9689 Other specified bacterial agents as the cause of diseases classified elsewhere: Secondary | ICD-10-CM

## 2016-10-21 LAB — POCT WET PREP (WET MOUNT)
Clue Cells Wet Prep Whiff POC: NEGATIVE
TRICHOMONAS WET PREP HPF POC: ABSENT

## 2016-10-21 MED ORDER — METRONIDAZOLE 500 MG PO TABS: 500.0000 mg | ORAL_TABLET | Freq: Two times a day (BID) | ORAL | 0 refills | Status: AC

## 2016-10-21 NOTE — Progress Notes (Signed)
   Subjective:   Patient ID: Traci Porter    DOB: 05/15/1990, 26 y.o. female   MRN: [id number]  CC: "Irregular vaginal bleeding"  HPI: Traci Porter is a 26 y.o. female who presents to clinic today for irregular vaginal bleeding. Problems discussed today are as follows:  Irregular vaginal bleeding: Onset 08/2015 with previous periods lasting 28-days with 5 days of bleeding without heavy bleeding. Patient without period for over 1 year but period restarted 07/2016 with extended period lasting 11 days with heavy bleeding followed by a second period starting today. Patient has children and desires to have more. No h/o birth control. No h/o STDs. Patient endorses dysparunia during the period. ROS: Denies fatigue, chest pain, dyspnea, abdominal pain, syncope.  Vaginal itching: Patient experiencing vaginal itching and grey discharge for past week. Is sexually active without birth control or condom use. Single partner for many years. Was seen 08/2016 and tested positive for BV and given Flagyl for 7 days which pt completed.  Complete ROS performed, see HPI for pertinent.  PMFSH: AUB, overweight, depression with history of SI. Smoking status reviewed. Medications reviewed.  Objective:   BP 110/72   Pulse 69   Temp 98.3 F (36.8 C) (Oral)   Ht 5' (1.524 m)   Wt 149 lb (67.6 kg)   LMP 10/21/2016   SpO2 98%   BMI 29.10 kg/m  Vitals and nursing note reviewed.  General: well nourished, well developed, in no acute distress with non-toxic appearance HEENT: normocephalic, atraumatic, moist mucous membranes CV: regular rate and rhythm without murmurs, rubs, or gallops, no lower extremity edema Lungs: clear to auscultation bilaterally with normal work of breathing Abdomen: soft, non-tender, non-distended, no masses or organomegaly palpable, normoactive bowel sounds GU: chaperon present during exam, no external lesions or masses, no foul odor present, minimal increase in secretions  without discoloration, cervix non-friable with minimal bleeding from os, mild tenderness at uterus sparing adnexa Skin: warm, dry, no rashes or lesions, cap refill < 2 seconds Extremities: warm and well perfused, normal tone  Assessment & Plan:   Abnormal uterine bleeding (AUB) A  Bacterial vaginosis A  Orders Placed This Encounter  Procedures  . POCT Wet Prep [facility]([place])   Meds ordered this encounter  Medications  . metroNIDAZOLE (FLAGYL) 500 MG tablet    Sig: Take 1 tablet (500 mg total) by mouth 2 (two) times daily.    Dispense:  14 tablet    Refill:  0    Durward Parcelavid McMullen, DO [facility], PGY-2 10/21/2016 5:02 PM

## 2016-10-21 NOTE — Assessment & Plan Note (Addendum)
Acute/Subacute. Recently treated 2 months ago. Few clue cells on wet prep. Will defer STD work-up given neg screen last visit and low-risk for STD. --Given Flagyl 500 mg BID for 7 days

## 2016-10-21 NOTE — Assessment & Plan Note (Addendum)
Chronic. Appears pts period cycles are beginning to restart after 1 year of amenorrhea. Currently on period. Desires children and not interested in contraception methods for bleed. Has orange card. --Discussed contraception methods with pt if bleeding continues to be an issue --Pt w/o insurance to cover fertility work up, pt in agreement to observe periods over next few months

## 2016-10-21 NOTE — Patient Instructions (Signed)
Thank you for coming in to see us today. Please see [place] to review our plan for today's visit.  It is unsure which causing your irregular bleeding. Given that you do not have insurance, we cannot pursue fertility workup. The only option we have at this point is for you to start birth control to stop the bleeding if that is what he desired. Being that you want future pregnancies, I would advise you to continue waiting to see how your cycle does over the course of the year and we can revisit this at another appointment.  Return to clinic for your next annual visit or as needed.  Please call the clinic at [phone] if your symptoms worsen or you have any concerns. It was my pleasure to see you. -- Durward Parcelavid Ladoris Lythgoe, DO [facility], PGY-2   Dysfunctional Uterine Bleeding Dysfunctional uterine bleeding is abnormal bleeding from the uterus. Dysfunctional uterine bleeding includes:  A period that comes earlier or later than usual.  A period that is lighter, heavier, or has blood clots.  Bleeding between periods.  Skipping one or more periods.  Bleeding after sexual intercourse.  Bleeding after menopause.  Follow these instructions at home: Pay attention to any changes in your symptoms. Follow these instructions to help with your condition: Eating and drinking  Eat well-balanced meals. Include foods that are high in iron, such as liver, meat, shellfish, green leafy vegetables, and eggs.  If you become constipated: ? Drink plenty of water. ? Eat fruits and vegetables that are high in water and fiber, such as spinach, carrots, raspberries, apples, and mango. Medicines  Take over-the-counter and prescription medicines only as told by your health care provider.  Do not change medicines without talking with your health care provider.  Aspirin or medicines that contain aspirin may make the bleeding worse. Do not take those medicines: ? During the week before your  period. ? During your period.  If you were prescribed iron pills, take them as told by your health care provider. Iron pills help to replace iron that your body loses because of this condition. Activity  If you need to change your sanitary pad or tampon more than one time every 2 hours: ? Lie in bed with your feet raised (elevated). ? Place a cold pack on your lower abdomen. ? Rest as much as possible until the bleeding stops or slows down.  Do not try to lose weight until the bleeding has stopped and your blood iron level is back to normal. Other Instructions  For two months, write down: ? When your period starts. ? When your period ends. ? When any abnormal bleeding occurs. ? What problems you notice.  Keep all follow up visits as told by your health care provider. This is important. Contact a health care provider if:  You get light-headed or weak.  You have nausea and vomiting.  You cannot eat or drink without vomiting.  You feel dizzy or have diarrhea while you are taking medicines.  You are taking birth control pills or hormones, and you want to change them or stop taking them. Get help right away if:  You develop a fever or chills.  You need to change your sanitary pad or tampon more than one time per hour.  Your bleeding becomes heavier, or your flow contains clots more often.  You develop pain in your abdomen.  You lose consciousness.  You develop a rash. This information is not intended to replace advice  given to you by your health care provider. Make sure you discuss any questions you have with your health care provider. Document Released: 03/25/2000 Document Revised: 09/03/2015 Document Reviewed: 06/23/2014 Elsevier Interactive Patient Education  [employer].

## 2017-01-19 ENCOUNTER — Ambulatory Visit (INDEPENDENT_AMBULATORY_CARE_PROVIDER_SITE_OTHER): Payer: Self-pay | Admitting: Family Medicine

## 2017-01-19 ENCOUNTER — Ambulatory Visit: Payer: Self-pay | Admitting: Family Medicine

## 2017-01-19 ENCOUNTER — Encounter: Payer: Self-pay | Admitting: Family Medicine

## 2017-01-19 VITALS — BP 108/68 | HR 78 | Temp 98.5°F | Ht 62.0 in | Wt 151.8 lb

## 2017-01-19 DIAGNOSIS — N898 Other specified noninflammatory disorders of vagina: Secondary | ICD-10-CM

## 2017-01-19 LAB — POCT URINALYSIS DIP (MANUAL ENTRY)
BILIRUBIN UA: NEGATIVE
Glucose, UA: NEGATIVE mg/dL
Ketones, POC UA: NEGATIVE mg/dL
LEUKOCYTES UA: NEGATIVE
NITRITE UA: NEGATIVE
PH UA: 7 (ref 5.0–8.0)
PROTEIN UA: NEGATIVE mg/dL
RBC UA: NEGATIVE
Spec Grav, UA: 1.015 (ref 1.010–1.025)
UROBILINOGEN UA: 0.2 U/dL

## 2017-01-19 LAB — POCT WET PREP (WET MOUNT)
Clue Cells Wet Prep Whiff POC: NEGATIVE
Trichomonas Wet Prep HPF POC: ABSENT

## 2017-01-19 LAB — POCT URINE PREGNANCY: PREG TEST UR: NEGATIVE

## 2017-01-19 MED ORDER — METRONIDAZOLE 500 MG PO TABS: 500.0000 mg | ORAL_TABLET | Freq: Two times a day (BID) | ORAL | 0 refills | Status: AC

## 2017-01-19 NOTE — Patient Instructions (Signed)
Thank you for coming in to see [place] today. Please see below to review our plan for today's visit.  Please pick up your antibiotics and take as directed. If your symptoms do not improve in 1 week return to the clinic.  Please call the clinic at [phone] if your symptoms worsen or you have any concerns. It was my pleasure to see you. -- Durward Parcel, DO [facility], PGY-2

## 2017-01-19 NOTE — Assessment & Plan Note (Addendum)
Acute. Wet prep with WBC and few clues. Neg for candida or trich. Neg preg. UA neg. --Will trial flagyl BID for 7 days --RTC in 1 week if not improved

## 2017-01-19 NOTE — Progress Notes (Signed)
   Subjective:   Patient ID: Traci Porter    DOB: 26-Jan-1991, 26 y.o. female   MRN: [id number]  CC: "UTI"  HPI: Traci Porter is a 26 y.o. female who presents for a same day appointment concerning UTI. Problems discussed today are as follows:  VAGINAL DISCHARGE  Having vaginal discharge for 11 days. Medications tried: no Discharge consistency: thick Discharge color: white Recent antibiotic use: no Sex in last month: yes Possible STD exposure: no  Symptoms Fever: no Dysuria: no Pruritis: yes Vaginal bleeding: no Abdomen or Pelvic pain: no Back pain: no Genital sores or ulcers: no Rash: no Pain during sex: yes, not normal Missed menstrual period: LMP 12/08/2016, has cycles every 3-4 months  Complete ROS performed, see HPI for pertinent.  PMFSH: Recently tx for BV, AUB, overweight, depression with history of SI. Surgical history unremarkable. Family history unremarkable. Smoking status reviewed. Medications reviewed.  Objective:   BP 108/68 (BP Location: Right Arm, Patient Position: Sitting, Cuff Size: Normal)   Pulse 78   Temp 98.5 F (36.9 C) (Oral)   Ht  (1.575 m)   Wt 151 lb 12.8 oz (68.9 kg)   LMP 01/08/2017 (Exact Date)   SpO2 99%   BMI 27.76 kg/m  Vitals and nursing note reviewed.  General: well nourished, well developed, in no acute distress with non-toxic appearance Abdomen: soft, non-tender, non-distended, no masses or organomegaly palpable, normoactive bowel sounds GU: accompanied by chaperone, no external lesions or rashes, minimal discharge without odor, os non-friable and minimally tender on palpation without adnexal tednerness Skin: warm, dry, no rashes or lesions, cap refill < 2 seconds Extremities: warm and well perfused, normal tone  Assessment & Plan:   Vaginal discharge Acute. Wet prep with WBC and few clues. Neg for candida or trich. Neg preg. UA neg. --Will trial flagyl BID for 7 days --RTC in 1 week if not  improved  Orders Placed This Encounter  Procedures  . POCT Wet Prep [employer])  . POCT urine pregnancy  . POCT urinalysis dipstick   Meds ordered this encounter  Medications  . metroNIDAZOLE (FLAGYL) 500 MG tablet    Sig: Take 1 tablet (500 mg total) by mouth 2 (two) times daily.    Dispense:  14 tablet    Refill:  0    Durward Parcel, DO [facility], PGY-2 01/19/2017 4:06 PM

## 2017-02-15 ENCOUNTER — Ambulatory Visit: Payer: Self-pay

## 2017-03-21 ENCOUNTER — Ambulatory Visit: Payer: Self-pay

## 2017-04-19 ENCOUNTER — Ambulatory Visit: Payer: Self-pay

## 2017-06-09 ENCOUNTER — Ambulatory Visit (INDEPENDENT_AMBULATORY_CARE_PROVIDER_SITE_OTHER): Payer: Self-pay | Admitting: Family Medicine

## 2017-06-09 ENCOUNTER — Other Ambulatory Visit: Payer: Self-pay

## 2017-06-09 ENCOUNTER — Encounter: Payer: Self-pay | Admitting: Family Medicine

## 2017-06-09 VITALS — BP 102/62 | HR 70 | Temp 98.5°F | Ht 62.0 in | Wt 152.0 lb

## 2017-06-09 DIAGNOSIS — E282 Polycystic ovarian syndrome: Secondary | ICD-10-CM

## 2017-06-09 DIAGNOSIS — Z0001 Encounter for general adult medical examination with abnormal findings: Secondary | ICD-10-CM

## 2017-06-09 DIAGNOSIS — Z0000 Encounter for general adult medical examination without abnormal findings: Secondary | ICD-10-CM

## 2017-06-09 DIAGNOSIS — Z23 Encounter for immunization: Secondary | ICD-10-CM

## 2017-06-09 NOTE — Progress Notes (Signed)
Date of Visit: 06/09/2017   HPI:  Patient presents today for a well woman exam.   Concerns today: irregular periods Periods: once every 3-4 months, last 5 days, very painful Contraception: no birth control, trying to get pregnant for 3 years Pelvic symptoms: no vaginal discharge or pain outside of periods Sexual activity: one female partner STD Screening: declines screening today Pap smear status: due January 2020 Exercise: no Smoking: no Alcohol: 2-4 times per month Drugs: no Mood: doing well, denies symptoms of depression/anxiety Dentist: has dentist  Note: patient completed ROS sheet which was positive for: Chest pain, constipation, abdominal pain, pain with urination, frequent urination, sexual difficulty, muscle cramps/aches, headaches.  Advised we would need more time in a separate visit to discuss all these concerns. I did clarify with her that chest pain is occurring about once per month, left side of chest radiating down and up to left shoulder, not worse with exertion, sore to touch at the time of the pain. Does not sound cardiac given reproducibility with palpation. Patient identified her main concern she wanted to discuss today is her irregular periods.  Also touched briefly on her headaches. Has had daily headache for the last couple of weeks, occurring in the afternoon. Bilateral frontotemporal pain, improved with lying down in dark room and resting eyes. Has not had eyes examined for need of glasses at all in last several years.  ROS: See HPI  PMFSH:  Medical History: PCOS, H pylori  PHYSICAL EXAM: BP 102/62   Pulse 70   Temp 98.5 F (36.9 C) (Oral)   Ht 5\' 2"  (1.575 m)   Wt 152 lb (68.9 kg)   LMP 05/08/2017   SpO2 99%   BMI 27.80 kg/m  Gen: NAD, pleasant, cooperative HEENT: NCAT, PERRL, no palpable thyromegaly or anterior cervical lymphadenopathy Heart: RRR, no murmurs Lungs: CTAB, NWOB Abdomen: soft, nontender to palpation Neuro: grossly nonfocal, speech  normal Extremities: No appreciable lower extremity edema bilaterally   ASSESSMENT/PLAN:  Health maintenance:  -STD screening: declines -pap smear: due Jan 2020 -lipid screening: normal lipids 2016, hold on rechecking at this time -immunizations: current, flu shot given today -handout given on health maintenance topics  PCOS (polycystic ovarian syndrome) Irregular periods q3-4 months, with infertility x3 years. Will refer to GYN for further evaluation. May benefit from letrozole vs other fertility workup.   Other concerns on ROS: - advised patient needs to schedule follow up visit to discuss these complaints in more detail - chest pain reproducible on palpation during episodes - unlikely cardiac etiology, can discuss more at follow up  - headaches - briefly discussed, recommend getting eyes checked to see if needs glasses given predominance of tension-sounding headaches in the afternoons. Can discuss more at follow up visit.   FOLLOW UP: Follow up at patient's convenience for items positive on ROS survey Referring to GYN.   GrenadaBrittany J. Pollie MeyerMcIntyre, MD [facility]

## 2017-06-09 NOTE — Patient Instructions (Signed)
Referring to gynecologist for PCOS Get your eyes checked  Schedule another appointment to talk about the other concerns you checked off today.  Be well, Dr. Ardelia Mems   Health Maintenance, Female Adopting a healthy lifestyle and getting preventive care can go a long way to promote health and wellness. Talk with your health care provider about what schedule of regular examinations is right for you. This is a good chance for you to check in with your provider about disease prevention and staying healthy. In between checkups, there are plenty of things you can do on your own. Experts have done a lot of research about which lifestyle changes and preventive measures are most likely to keep you healthy. Ask your health care provider for more information. Weight and diet Eat a healthy diet  Be sure to include plenty of vegetables, fruits, low-fat dairy products, and lean protein.  Do not eat a lot of foods high in solid fats, added sugars, or salt.  Get regular exercise. This is one of the most important things you can do for your health. ? Most adults should exercise for at least 150 minutes each week. The exercise should increase your heart rate and make you sweat (moderate-intensity exercise). ? Most adults should also do strengthening exercises at least twice a week. This is in addition to the moderate-intensity exercise.  Maintain a healthy weight  Body mass index (BMI) is a measurement that can be used to identify possible weight problems. It estimates body fat based on height and weight. Your health care provider can help determine your BMI and help you achieve or maintain a healthy weight.  For females 8 years of age and older: ? A BMI below 18.5 is considered underweight. ? A BMI of 18.5 to 24.9 is normal. ? A BMI of 25 to 29.9 is considered overweight. ? A BMI of 30 and above is considered obese.  Watch levels of cholesterol and blood lipids  You should start having your blood  tested for lipids and cholesterol at 29 years of age, then have this test every 5 years.  You may need to have your cholesterol levels checked more often if: ? Your lipid or cholesterol levels are high. ? You are older than 29 years of age. ? You are at high risk for heart disease.  Cancer screening Lung Cancer  Lung cancer screening is recommended for adults 3-26 years old who are at high risk for lung cancer because of a history of smoking.  A yearly low-dose CT scan of the lungs is recommended for people who: ? Currently smoke. ? Have quit within the past 15 years. ? Have at least a 30-pack-year history of smoking. A pack year is smoking an average of one pack of cigarettes a day for 1 year.  Yearly screening should continue until it has been 15 years since you quit.  Yearly screening should stop if you develop a health problem that would prevent you from having lung cancer treatment.  Breast Cancer  Practice breast self-awareness. This means understanding how your breasts normally appear and feel.  It also means doing regular breast self-exams. Let your health care provider know about any changes, no matter how small.  If you are in your 20s or 30s, you should have a clinical breast exam (CBE) by a health care provider every 1-3 years as part of a regular health exam.  If you are 24 or older, have a CBE every year. Also consider having a breast  X-ray (mammogram) every year.  If you have a family history of breast cancer, talk to your health care provider about genetic screening.  If you are at high risk for breast cancer, talk to your health care provider about having an MRI and a mammogram every year.  Breast cancer gene (BRCA) assessment is recommended for women who have family members with BRCA-related cancers. BRCA-related cancers include: ? Breast. ? Ovarian. ? Tubal. ? Peritoneal cancers.  Results of the assessment will determine the need for genetic counseling and  BRCA1 and BRCA2 testing.  Cervical Cancer Your health care provider may recommend that you be screened regularly for cancer of the pelvic organs (ovaries, uterus, and vagina). This screening involves a pelvic examination, including checking for microscopic changes to the surface of your cervix (Pap test). You may be encouraged to have this screening done every 3 years, beginning at age 41.  For women ages 53-65, health care providers may recommend pelvic exams and Pap testing every 3 years, or they may recommend the Pap and pelvic exam, combined with testing for human papilloma virus (HPV), every 5 years. Some types of HPV increase your risk of cervical cancer. Testing for HPV may also be done on women of any age with unclear Pap test results.  Other health care providers may not recommend any screening for nonpregnant women who are considered low risk for pelvic cancer and who do not have symptoms. Ask your health care provider if a screening pelvic exam is right for you.  If you have had past treatment for cervical cancer or a condition that could lead to cancer, you need Pap tests and screening for cancer for at least 20 years after your treatment. If Pap tests have been discontinued, your risk factors (such as having a new sexual partner) need to be reassessed to determine if screening should resume. Some women have medical problems that increase the chance of getting cervical cancer. In these cases, your health care provider may recommend more frequent screening and Pap tests.  Colorectal Cancer  This type of cancer can be detected and often prevented.  Routine colorectal cancer screening usually begins at 29 years of age and continues through 29 years of age.  Your health care provider may recommend screening at an earlier age if you have risk factors for colon cancer.  Your health care provider may also recommend using home test kits to check for hidden blood in the stool.  A small camera  at the end of a tube can be used to examine your colon directly (sigmoidoscopy or colonoscopy). This is done to check for the earliest forms of colorectal cancer.  Routine screening usually begins at age 69.  Direct examination of the colon should be repeated every 5-10 years through 29 years of age. However, you may need to be screened more often if early forms of precancerous polyps or small growths are found.  Skin Cancer  Check your skin from head to toe regularly.  Tell your health care provider about any new moles or changes in moles, especially if there is a change in a mole's shape or color.  Also tell your health care provider if you have a mole that is larger than the size of a pencil eraser.  Always use sunscreen. Apply sunscreen liberally and repeatedly throughout the day.  Protect yourself by wearing long sleeves, pants, a wide-brimmed hat, and sunglasses whenever you are outside.  Heart disease, diabetes, and high blood pressure  High blood  pressure causes heart disease and increases the risk of stroke. High blood pressure is more likely to develop in: ? People who have blood pressure in the high end of the normal range (130-139/85-89 mm Hg). ? People who are overweight or obese. ? People who are African American.  If you are 67-37 years of age, have your blood pressure checked every 3-5 years. If you are 29 years of age or older, have your blood pressure checked every year. You should have your blood pressure measured twice-once when you are at a hospital or clinic, and once when you are not at a hospital or clinic. Record the average of the two measurements. To check your blood pressure when you are not at a hospital or clinic, you can use: ? An automated blood pressure machine at a pharmacy. ? A home blood pressure monitor.  If you are between 6 years and 14 years old, ask your health care provider if you should take aspirin to prevent strokes.  Have regular diabetes  screenings. This involves taking a blood sample to check your fasting blood sugar level. ? If you are at a normal weight and have a low risk for diabetes, have this test once every three years after 29 years of age. ? If you are overweight and have a high risk for diabetes, consider being tested at a younger age or more often. Preventing infection Hepatitis B  If you have a higher risk for hepatitis B, you should be screened for this virus. You are considered at high risk for hepatitis B if: ? You were born in a country where hepatitis B is common. Ask your health care provider which countries are considered high risk. ? Your parents were born in a high-risk country, and you have not been immunized against hepatitis B (hepatitis B vaccine). ? You have HIV or AIDS. ? You use needles to inject street drugs. ? You live with someone who has hepatitis B. ? You have had sex with someone who has hepatitis B. ? You get hemodialysis treatment. ? You take certain medicines for conditions, including cancer, organ transplantation, and autoimmune conditions.  Hepatitis C  Blood testing is recommended for: ? Everyone born from [birth year] through 1967. ? Anyone with known risk factors for hepatitis C.  Sexually transmitted infections (STIs)  You should be screened for sexually transmitted infections (STIs) including gonorrhea and chlamydia if: ? You are sexually active and are younger than 29 years of age. ? You are older than 29 years of age and your health care provider tells you that you are at risk for this type of infection. ? Your sexual activity has changed since you were last screened and you are at an increased risk for chlamydia or gonorrhea. Ask your health care provider if you are at risk.  If you do not have HIV, but are at risk, it may be recommended that you take a prescription medicine daily to prevent HIV infection. This is called pre-exposure prophylaxis (PrEP). You are considered at risk  if: ? You are sexually active and do not regularly use condoms or know the HIV status of your partner(s). ? You take drugs by injection. ? You are sexually active with a partner who has HIV.  Talk with your health care provider about whether you are at high risk of being infected with HIV. If you choose to begin PrEP, you should first be tested for HIV. You should then be tested every 3 months for as  long as you are taking PrEP. Pregnancy  If you are premenopausal and you may become pregnant, ask your health care provider about preconception counseling.  If you may become pregnant, take 400 to 800 micrograms (mcg) of folic acid every day.  If you want to prevent pregnancy, talk to your health care provider about birth control (contraception). Osteoporosis and menopause  Osteoporosis is a disease in which the bones lose minerals and strength with aging. This can result in serious bone fractures. Your risk for osteoporosis can be identified using a bone density scan.  If you are 94 years of age or older, or if you are at risk for osteoporosis and fractures, ask your health care provider if you should be screened.  Ask your health care provider whether you should take a calcium or vitamin D supplement to lower your risk for osteoporosis.  Menopause may have certain physical symptoms and risks.  Hormone replacement therapy may reduce some of these symptoms and risks. Talk to your health care provider about whether hormone replacement therapy is right for you. Follow these instructions at home:  Schedule regular health, dental, and eye exams.  Stay current with your immunizations.  Do not use any tobacco products including cigarettes, chewing tobacco, or electronic cigarettes.  If you are pregnant, do not drink alcohol.  If you are breastfeeding, limit how much and how often you drink alcohol.  Limit alcohol intake to no more than 1 drink per day for nonpregnant women. One drink  equals 12 ounces of beer, 5 ounces of wine, or 1 ounces of hard liquor.  Do not use street drugs.  Do not share needles.  Ask your health care provider for help if you need support or information about quitting drugs.  Tell your health care provider if you often feel depressed.  Tell your health care provider if you have ever been abused or do not feel safe at home. This information is not intended to replace advice given to you by your health care provider. Make sure you discuss any questions you have with your health care provider. Document Released: 10/11/2010 Document Revised: 09/03/2015 Document Reviewed: 12/30/2014 Elsevier Interactive Patient Education  [employer].

## 2017-06-12 NOTE — Assessment & Plan Note (Signed)
Irregular periods q3-4 months, with infertility x3 years. Will refer to GYN for further evaluation. May benefit from letrozole vs other fertility workup.

## 2017-07-19 ENCOUNTER — Ambulatory Visit (HOSPITAL_COMMUNITY)
Admission: RE | Admit: 2017-07-19 | Discharge: 2017-07-19 | Disposition: A | Discharge status: Home / Self Care | Payer: Self-pay | Source: Ambulatory Visit | Attending: Family Medicine | Admitting: Family Medicine

## 2017-07-19 ENCOUNTER — Ambulatory Visit (INDEPENDENT_AMBULATORY_CARE_PROVIDER_SITE_OTHER): Payer: Self-pay | Admitting: Internal Medicine

## 2017-07-19 ENCOUNTER — Other Ambulatory Visit: Payer: Self-pay

## 2017-07-19 ENCOUNTER — Encounter: Payer: Self-pay | Admitting: Internal Medicine

## 2017-07-19 DIAGNOSIS — G8929 Other chronic pain: Secondary | ICD-10-CM

## 2017-07-19 DIAGNOSIS — R079 Chest pain, unspecified: Secondary | ICD-10-CM | POA: Insufficient documentation

## 2017-07-19 DIAGNOSIS — R0789 Other chest pain: Secondary | ICD-10-CM

## 2017-07-19 NOTE — Assessment & Plan Note (Signed)
Ongoing for multiple months, though acute worsened in past week. Pain is constant, sharp, and located slightly to L of midline. Most likely MSK, probably costochondritis, given reproducibility with palpation as well as movement of upper extremities on strength testing. Improvement with naproxen also supports this diagnosis. Less likely cardiac etiology as constant for a week, though she does have palpitations. Cardiac exam WNL today and EKG in office NSR with no abnormalities, which is reassuring. Less likely pulmonary cause, as only endorsing occasionally sensation of needing to take a deep breath rather than true difficulty breathing. Wells Score 0, so less likely PE. Less likely pneumothorax as patient with normal breath sounds and again, no true difficulty breathing, and patient not meeting typical patient profile (not a thin female). Possibly pericarditis, though no recent fevers or illnesses, no improvement with leaning forward, and would not expect TTP. Also with no pericarditis associated changes on EKG. Cannot rule out anxiety or psych component, however patient does not appear anxious today and denies anxiety or new life stressors/changes. GERD also included in differential as pain is close to midline, however denies burning sensation or relation to food. If pain persists despite naproxen, could consider beginning PPI. Patient can continue naproxen, as this has been helpful, and can also use OTC muscle rub (no insurance so will not prescribe Voltaren gel given cost). Strict return precautions discussed.

## 2017-07-19 NOTE — Patient Instructions (Addendum)
It was nice meeting you today Traci Porter!  Your chest pain is most likely due to costochondritis, inflammation of the chest wall. You can continue to take the medicine you have been taking (naproxen) for this pain. You can also use a muscle rub or cream from the grocery store or pharmacy (examples: [facility]) on your chest to help with the pain.   If the pain worsens, or begins to spread to your arm or back, or if you have severe difficulty breathing due to the pain, please call our office or go to the emergency room.   If you have any questions or concerns, please feel free to call the clinic.   Be well,  Dr. Natale MilchLancaster

## 2017-07-19 NOTE — Progress Notes (Signed)
   Subjective:   Patient: Traci Porter       Birthdate: 19-Dec-1990       MRN: [id number]      HPI  Traci Porter is a 27 y.o. female presenting for chest pain.   Chest pain Ongoing for many months, however has become persistent over past week. Previously was only occurring about once per month. Then was located on L side of chest and radiated to L shoulder and L abdomen. Over past week has been present just to L of midline and does not radiate. Has also been constant. Describes as sharp pain. Is sore to touch. Naproxen helps. Sometimes feels as if she has to take a deep breath. No pain with deep inspiration. No N/V. A couple episodes of night sweats. No burning sensation in chest. Cannot say if pain is related to eating. Has had difficulty sleeping for past two nights due to pain. No new activities/exercises. No recent lifting. No fevers. HA, but not new, and thinks is worse because she has not been sleeping well. No new stressors or changes in life. No anxiety. Endorses occasional palpitations.   Smoking status reviewed. Patient is never smoker.   Review of Systems See HPI.     Objective:  Physical Exam  Constitutional: She appears well-developed and well-nourished.  HENT:  Head: Normocephalic and atraumatic.  Cardiovascular: Normal rate, regular rhythm and normal heart sounds.  No murmur heard. Pulmonary/Chest: Effort normal and breath sounds normal. No stridor. No respiratory distress.  Musculoskeletal:  TTP of chest wall to L of midline. Pain worsened with movement of upper extremities.   Skin: Skin is warm and dry.  Psychiatric: She has a normal mood and affect. Her behavior is normal.      Assessment & Plan:  Chest wall pain, chronic Ongoing for multiple months, though acute worsened in past week. Pain is constant, sharp, and located slightly to L of midline. Most likely MSK, probably costochondritis, given reproducibility with palpation as well as movement of  upper extremities on strength testing. Improvement with naproxen also supports this diagnosis. Less likely cardiac etiology as constant for a week, though she does have palpitations. Cardiac exam WNL today and EKG in office NSR with no abnormalities, which is reassuring. Less likely pulmonary cause, as only endorsing occasionally sensation of needing to take a deep breath rather than true difficulty breathing. Wells Score 0, so less likely PE. Less likely pneumothorax as patient with normal breath sounds and again, no true difficulty breathing, and patient not meeting typical patient profile (not a thin female). Possibly pericarditis, though no recent fevers or illnesses, no improvement with leaning forward, and would not expect TTP. Also with no pericarditis associated changes on EKG. Cannot rule out anxiety or psych component, however patient does not appear anxious today and denies anxiety or new life stressors/changes. GERD also included in differential as pain is close to midline, however denies burning sensation or relation to food. If pain persists despite naproxen, could consider beginning PPI. Patient can continue naproxen, as this has been helpful, and can also use OTC muscle rub (no insurance so will not prescribe Voltaren gel given cost). Strict return precautions discussed.  Precepted with Dr. Gwendolyn GrantWalden.   Tarri AbernethyAbigail J Mete Purdum, MD, MPH PGY-3 [facility] Pager [phone]

## 2017-08-07 ENCOUNTER — Encounter: Payer: Self-pay | Admitting: Obstetrics & Gynecology

## 2017-10-18 ENCOUNTER — Ambulatory Visit: Payer: Self-pay

## 2017-12-18 ENCOUNTER — Other Ambulatory Visit: Payer: Self-pay

## 2017-12-18 ENCOUNTER — Ambulatory Visit (INDEPENDENT_AMBULATORY_CARE_PROVIDER_SITE_OTHER): Payer: Self-pay | Admitting: Family Medicine

## 2017-12-18 ENCOUNTER — Encounter: Payer: Self-pay | Admitting: Family Medicine

## 2017-12-18 VITALS — BP 112/62 | HR 55 | Temp 98.1°F | Wt 155.4 lb

## 2017-12-18 DIAGNOSIS — N970 Female infertility associated with anovulation: Secondary | ICD-10-CM

## 2017-12-18 DIAGNOSIS — K5904 Chronic idiopathic constipation: Secondary | ICD-10-CM

## 2017-12-18 DIAGNOSIS — G8929 Other chronic pain: Secondary | ICD-10-CM

## 2017-12-18 DIAGNOSIS — K602 Anal fissure, unspecified: Secondary | ICD-10-CM

## 2017-12-18 DIAGNOSIS — R102 Pelvic and perineal pain: Secondary | ICD-10-CM

## 2017-12-18 DIAGNOSIS — E282 Polycystic ovarian syndrome: Secondary | ICD-10-CM

## 2017-12-18 MED ORDER — POLYETHYLENE GLYCOL 3350 17 GM/SCOOP PO POWD: ORAL | 3 refills | Status: DC

## 2017-12-18 MED ORDER — PRENATA 29-1 MG PO CHEW: 1.0000 | CHEWABLE_TABLET | Freq: Every day | ORAL | 11 refills | Status: DC

## 2017-12-18 NOTE — Patient Instructions (Signed)
It was wonderful to see you today.  Thank you for choosing [facility].   Please call [phone] with any questions about today's appointment.  Please be sure to schedule follow up at the front  desk before you leave today.   Fields Oros, MD  Family Medicine    

## 2017-12-18 NOTE — Progress Notes (Signed)
Patient Name: Traci Porter Date of Birth: 09-23-1990 Date of Visit: 12/18/17 PCP: Latrelle Dodrill, MD  Chief Complaint: irregular menses and constipation  Subjective: Traci Porter is a pleasant 27 y.o. year old woman with a history of suspected polycystic ovarian syndrome, fertility concerns, and overweight status presenting today for follow-up.  She is alone today.  [name] reports that overall she is doing well she is the mother of an 4 year old.  She reports her family is doing well.  Mr. Ardyth Harps reports difficulty with her periods.  Her last period was in March.  She reports ever since the birth of her child she has had irregular menses.  Menarche was at age 13.  Up until age 29 her menses were regular.  After the birth of her child her.  Return to 8 months.  She was able to breast-feed.  She then had a few years of heavy irregular periods now she has periods every 3 to 6 months.  Her periods are painful when they come.  She denies headaches, galactorrhea, or worsening pelvic pain.  She is sexually active with one partner.  They do not use condoms.  They have unprotected sex more than once a week.  They have been trying to have an infant for more than 2 years.  She does not regularly take a prenatal vitamin.  She had no difficulty with fertility in her first pregnancy.  She has gained weight since her first pregnancy.  [name] reports ongoing bowels with constipation.  She reports this has been a problem for several years.  She has intermittent bloating and constipation.  She has a bowel movement which is hard every 3 to 4 days.  She has noticed some bright red blood on the tissue paper after she wiped several times over the last few weeks.  This has occurred before.  She denies itching or significant pain with defecation.  She denies a family history of colon cancer or inflammatory bowel disease.    ROS:  ROS Negative except for as above.  I have  reviewed the patient's medical, surgical, family, and social history as appropriate.   Vitals:   12/18/17 0902  BP: 112/62  Pulse: (!) 55  Temp: 98.1 F (36.7 C)  SpO2: 99%   Filed Weights   12/18/17 0902  Weight: 155 lb 6.4 oz (70.5 kg)  HEENT: Sclera anicteric. Dentition is moderate. Appears well hydrated. Neck: Supple Cardiac: Regular rate and rhythm. Normal S1/S2. No murmurs, rubs, or gallops appreciated. Lungs: Clear bilaterally to ascultation.  Abdomen: Normoactive bowel sounds. No tenderness to deep or light palpation. No rebound or guarding.  Rectal: Chaperoned exam.  Notable for a small healing fissure at the 11 o'clock position on the inferior rectum.  No hemorrhoids.  Reviewed prior data including hemoglobin A1c, normal TSH, normal prolactin level.  Reviewed prior ultrasound showing multiple cyst on the ovaries.  Marylee was seen today for abdominal pain.  Diagnoses and all orders for this visit:   Anal fissure, new, healing, discussed ways to reduce recurrence.  If she continues to have bleeding or change in symptoms would recommend further evaluation with consideration of anoscopy and/or endoscopic evaluation.  Chronic idiopathic constipation no significant pelvic pain with this or abdominal pain with this.  This may be related to IBS.  This may also be related to her diet.  We discussed dietary interventions at length including increasing leafy green vegetables, increasing water, increasing fiber, increasing whole-grain vegetables.  Discussed titration  of MiraLAX as below -     polyethylene glycol powder (MIRALAX) powder; Use 1 capful in 8 ounces of water twice daily - Could consider Celiac testing in future.   PCOS (polycystic ovarian syndrome) discussed the diagnosis.  We discussed the role of weight gain in both the setting of infertility and polycystic ovarian syndrome.  We discussed strategies for weight loss including increasing exercise and reducing juices.  We also  discussed the role of metformin she would like to discuss further with the gynecologist. -     Ambulatory referral to Gynecology -     Prenatal w/o A Vit-Fe Fum-FA (PRENATAL VITAMIN W/FE, FA) 29-1 MG CHEW; Chew 1 tablet by mouth daily.  Terisa Starr, MD  Family Medicine Teaching Service

## 2017-12-18 NOTE — Assessment & Plan Note (Signed)
Discussed today as relates to fertility concerns. Discussed starting metformin. Patient would prefer to see Gynecology.

## 2018-01-29 ENCOUNTER — Encounter: Payer: Self-pay | Admitting: Obstetrics & Gynecology

## 2018-01-29 ENCOUNTER — Ambulatory Visit (INDEPENDENT_AMBULATORY_CARE_PROVIDER_SITE_OTHER): Payer: Self-pay | Admitting: Obstetrics & Gynecology

## 2018-01-29 VITALS — BP 125/72 | HR 68 | Ht 62.0 in | Wt 153.9 lb

## 2018-01-29 DIAGNOSIS — N946 Dysmenorrhea, unspecified: Secondary | ICD-10-CM

## 2018-01-29 DIAGNOSIS — E282 Polycystic ovarian syndrome: Secondary | ICD-10-CM

## 2018-01-29 DIAGNOSIS — N979 Female infertility, unspecified: Secondary | ICD-10-CM

## 2018-01-29 MED ORDER — METFORMIN HCL 500 MG PO TABS: ORAL_TABLET | ORAL | 5 refills | Status: DC

## 2018-01-29 MED ORDER — NAPROXEN 500 MG PO TABS
500.0000 mg | ORAL_TABLET | Freq: Two times a day (BID) | ORAL | 2 refills | Status: DC
Start: 2018-01-29 — End: 2019-08-01

## 2018-01-29 NOTE — Progress Notes (Signed)
GYNECOLOGY OFFICE VISIT NOTE  History:  27 y.o. G1P1 referred here today for discussion of management of PCOS related secondary infetility. Patient is Spanish-speaking only, Spanish interpreter present for this encounter.  On talking to patient and review of previous notes, she reports difficulty with her periods, LMP was in March.  She has an 11 year child, and reported having irregular menses since her child was born. She had a few years of heavy, very painful irregular periods now she has periods every 3 to 6 months.  Her periods are painful and and very heavy when they come.   She is sexually active with one partner. They have unprotected sex more than once a week, and have been trying to get pregnant for more than 3 years. She had no difficulty with fertility in her first pregnancy.  She has gained weight since her first pregnancy. She denies any abnormal vaginal discharge, bleeding, pelvic pain or other concerns.  Of note, on review of notes, there is a mention that her husband had a semen analysis that showed aspermia in 2017; he had no follow up analysis.   Past Medical History:  Diagnosis Date  . Chronic constipation   . Infertility associated with anovulation   . PCOS (polycystic ovarian syndrome) 03/08/2016   Ultrasound 03/01/2016 IMPRESSION: 1. No acute findings identified 2. Increased volume of both ovaries. In the absence of oral contraceptive therapy findings meet diagnostic criteria for polycystic ovarian syndrome (PCOS). This is Per 2003 consensus conference statement by the European Society for Human Reproduction and Embryology and the American Society for Reproductive Medicine    Past Surgical History:  Procedure Laterality Date  . implanon removed  03/2009    The following portions of the patient's history were reviewed and updated as appropriate: allergies, current medications, past family history, past medical history, past social history, past surgical history and problem  list.   Health Maintenance:  Normal pap in 04/28/2015.  Review of Systems:  Pertinent items noted in HPI and remainder of comprehensive ROS otherwise negative.  Objective:  Physical Exam BP 125/72   Pulse 68   Ht 5' 2"  (1.575 m)   Wt 153 lb 14.4 oz (69.8 kg)   LMP 07/02/2017 (Exact Date)   BMI 28.15 kg/m  CONSTITUTIONAL: Well-developed, well-nourished female in no acute distress.  HEENT:  Normocephalic, atraumatic. External right and left ear normal. No scleral icterus.  NECK: Normal range of motion, supple, no masses noted on observation SKIN: Skin is warm and dry. No rash noted. Not diaphoretic. No erythema. No pallor. MUSCULOSKELETAL: Normal range of motion. No edema noted. NEUROLOGIC: Alert and oriented to person, place, and time. Normal muscle tone coordination. No cranial nerve deficit noted. PSYCHIATRIC: Normal mood and affect. Normal behavior. Normal judgment and thought content. CARDIOVASCULAR: Normal heart rate noted RESPIRATORY: Effort and breath sounds normal, no problems with respiration noted ABDOMEN: No overt distention noted.   PELVIC: Deferred  Assessment & Plan:   1. PCOS (polycystic ovarian syndrome) 2. Secondary female infertility Counseled patient about management of PCOS, recommended weight loss which helps with restoring ovulatory cycles, decreases glucose intolerance with improvement of metabolic risk, improves fertility/pregnancy rates and helps with overall health.  Even modest weight loss (5 to 10 percent reduction in body weight) in women with PCOS may result in these effects.   PCOS is also treated with Metformin given its association with glucose intolerance and insulin resistance.  Over 50% of PCOS patients on 1548m of Metformin daily have been  shown to ovulate successfully. Common side effects include GI intolerance, kidney and liver enzyme irregularities, lactic acidosis.  Normal LFTs and Cr in chart. She was prescribed Metformin 500 mg po daily x 1  week, then bid x 1 week, then tid for one week. If she tolerates this, she can then be switched to 850 mg po bid or 1000 mg po bid. Will continue to follow.  Patient will return in 1 month for followup and repeat CMET check.   The patient was presented with management with clomiphene citrate (Clomid) in addition to Metformin to maximize chances of conception. But prior to giving her this agent which could have a myriad of side effects, her husband will need to repeat semen analysis.  Of note, some experts now suggest letrozole over clomiphene for ovulation induction in PCOS patients, but not in patients with a BMI <30 kg/m2 . Current Body mass index is 28.15 kg/m.  Patient was given the option to be referred to a Reproductive Endocrinology and Infertility Specialist now or at any time for further evaluation, she declines referral for now due to cost. Was told to call these offices to set up for husband to ger semen analysis. - metFORMIN (GLUCOPHAGE) 500 MG tablet; Toma una por dia por una semana. Despues, toma una en la [employer] y una en la tarde por una semana. Si la toleras, toma una tres veces por dia  Dispense: 60 tablet; Refill: 5   3. Dysmenorrhea Naproxen prescribed for pain - naproxen (NAPROSYN) 500 MG tablet; Take 1 tablet (500 mg total) by mouth 2 (two) times daily with a meal. As needed for pain  Dispense: 60 tablet; Refill: 2   Routine preventative health maintenance measures emphasized. Please refer to After Visit Summary for other counseling recommendations.   Return in about 1 month (around 03/01/2018) for PCOS Follow up (Spanish interpreter needed).   Total face-to-face time with patient: 30 minutes.  Over 50% of encounter was spent on counseling and coordination of care.   Verita Schneiders, MD, [place] for [employer], [employer]

## 2018-01-29 NOTE — Patient Instructions (Addendum)
Tu esposo necesita examen de semen Dr. April Manson [facility] in [place] (oficina en [place])   Eleonore Chiquito en un mes    Sndrome ovrico poliqustico (Polycystic Ovarian Syndrome) El sndrome ovrico poliqustico (PCOS) es un trastorno hormonal comn en mujeres en edad reproductiva. La mayora de las mujeres con PCOS tienen pequeos sacos llenos de lquido (quistes) que crecen en los ovarios y Terral no son parte del ciclo normal de Building control surveyor. Esto puede ocasionar problemas con los perodos menstruales y dificultades para quedar embarazada. Tambin puede aumentar el riesgo de aborto espontneo. Si no se trata, el sndrome ovrico poliqustico puede acarrear graves problemas de [place], como diabetes y enfermedades del Programmer, multimedia. CAUSAS Se desconoce la causa del sndrome ovrico poliqustico, aunque puede ser la combinacin de ciertos factores, por ejemplo:  Perodos menstruales irregulares.  Niveles altos de ciertas hormonas (andrgenos).  Problemas con las hormonas que ayudan a Chief Operating Officer la glucemia (resistencia a la insulina).  Ciertos genes. FACTORES DE RIESGO Es ms probable que esta afeccin se produzca en mujeres con antecedentes familiares de PCOS. SNTOMAS Los sntomas del PCOS pueden incluir lo siguiente:  Mltiples quistes ovricos.  Perodos menstruales irregulares o ausentes.  Perodos menstruales que son muy frecuentes o demasiado intensos.  Perodos menstruales impredecibles.  Incapacidad para quedar embarazada (infertilidad) debido a la falta de ovulacin.  Aumento del crecimiento del vello en el rostro, el trax, el [address], la espalda, los muslos o los dedos de los pies.  Piel grasa o acn. El acn se puede desarrollar durante la [place] y es posible que no responda a Pharmacist, community.  Dolor en la pelvis.  Sobrepeso u obesidad.  Manchas de piel gruesa marrn oscura o negra en el cuello, brazos, pechos o caderas (acanthosis nigricans).  Exceso de  crecimiento de vello en el rostro, el pecho, el abdomen o la parte superior de las caderas (hirsutismo). DIAGNSTICO Esta afeccin se diagnostica en funcin de lo siguiente:  Sus antecedentes mdicos.  Un examen fsico, incluido un examen plvico. El mdico puede buscar las zonas con crecimiento de vello sobre la piel.  Estudios, por ejemplo: ? Ecografa. Esto puede utilizarse para [employer] ovarios y las paredes del tero (endometrio) en busca de quistes. ? Anlisis de [place]. Estos se pueden Chemical engineer para Glass blower/designer [employer] niveles de azcar (glucosa), las hormonas masculinas (testosterona) y las hormonas femeninas (estrgeno) y (progesterona). TRATAMIENTO No hay cura para el sndrome ovrico poliqustico, pero un tratamiento puede ayudar a Chief Operating Officer los sntomas y Automotive engineer que se desarrollen otros problemas de [place]. Los tratamientos varan en funcin de lo siguiente:  Sus sntomas.  Si desea tener un beb o usar un mtodo de control de la natalidad (anticonceptivos). El tratamiento puede incluir cambios en la dieta y el estilo de vida junto con:  Hormona progesterona, para iniciar un perodo menstrual.  Pldoras anticonceptivas para Orthoptist.  Medicamentos para estimular la ovulacin, si quiere quedar embarazada.  Medicamentos para reducir el crecimiento excesivo de vello.  Kandis Ban, solo [employer] son graves. Esta puede consistir en hacer pequeos orificios en uno o ambos ovarios. Esto disminuye la cantidad de testosterona que produce el cuerpo. INSTRUCCIONES PARA EL CUIDADO EN EL HOGAR  Tome los medicamentos de venta libre y los recetados solamente como se lo haya indicado el mdico.  Siga un plan de alimentacin saludable. Esto puede ayudar a Web designer del sndrome ovrico poliqustico. ? Un plan de alimentacin saludable incluye consumir protenas magras, hidratos de carbono complejos, frutas y verduras  frescas, productos  lcteos con bajo contenido de [place] y grasas saludables. Asegrese de Teacher, music.  Si tiene sobrepeso, baje de [employer] se lo haya indicado el mdico. ? Perder el 10% del peso corporal puede mejorar los sntomas. ? El mdico puede determinar cuntos kilos tiene que bajar y [place] a que adelgace de Wellsite geologist segura.  Concurra a todas las visitas de control como se lo haya indicado el mdico. Esto es importante. SOLICITE ATENCIN MDICA SI:  Los sntomas no mejoran con los [employer].  Presenta nuevos sntomas. Esta informacin no tiene Theme park manager el consejo del mdico. Asegrese de hacerle al mdico cualquier pregunta que tenga. Document Released: 07/14/2008 Document Revised: 01/16/2013 Document Reviewed: 09/13/2015 Elsevier Interactive Patient Education  2018 Elsevier Inc.    Dieta para el sndrome ovrico poliqustico (Diet for Polycystic Ovarian Syndrome) El sndrome ovrico poliqustico (SOP) es un trastorno de los mensajeros qumicos (hormonas) que Charity fundraiser. La afeccin causa el desequilibrio de hormonas importantes. El SOP puede causar lo siguiente:  Menstruaciones irregulares o la ausencia de la [place].  El desarrollo de quistes en los ovarios.  Dificultad para quedar embarazada.  La inhibicin de la respuesta del organismo a los efectos de la insulina (resistencia a la [place]), lo que puede derivar en obesidad y diabetes. Modificar la dieta puede ayudar a Retail banker bajo control y a Scientist, clinical (histocompatibility and immunogenetics) su estado de [place]. Puede ser de ayuda para bajar de peso y Scientist, clinical (histocompatibility and immunogenetics) la forma en que el organismo hace uso de la insulina. EN QU CONSISTE EL PLAN?  [place], almuerce y cene, y [address] colaciones diarias.  Incluya protenas en cada comida y colacin.  Elija los Counselling psychologist de los productos elaborados con [place].  Consuma diferentes alimentos.  Haga actividad fsica habitualmente como se lo haya  indicado el mdico. QU DEBO SABER ACERCA DE ESTE PLAN DE ALIMENTACIN? Si tiene sobrepeso u obesidad, preste atencin a la cantidad de [employer]onic. Reducir las caloras puede ayudarlo a Publishing copy de [place]. Trabaje con el mdico o el nutricionista para saber cuntas caloras Psychologist, forensic. QU ALIMENTOS PUEDO COMER? Cereales Cereales integrales, como salvado. Panes, galletitas, cereales y pastas integrales. Avena sin endulzar, trigo, [place], quinua o arroz integral. Tortillas de harina de maz o de salvado. Hoover Brunette Deatra James. Espinaca. Guisantes. Remolachas. Coliflor. Repollo. Brcoli. Zanahorias. Tomates. Calabaza. Christella Noa. Hierbas. Pimienta. Cebollas. Pepinos. Repollitos de Bruselas. Frutas Frutos rojos. Bananas. Manzanas. Naranjas. Uvas. Papaya. Mango. [place]. Kiwi. Pomelo. Cerezas. Carnes y [address] fuentes de protenas Protenas magras, como pescado, pollo, frijoles, huevos y tofu. Lcteos Productos lcteos con bajo contenido de [place], como [place], palitos de queso y Dentist. Bebidas Bebidas con bajo contenido de grasa o sin Kelvin Cellar semidescremada, [place] sin azcar y jugo 100% de frutas. Condimentos [place]. Mostaza. Salsa barbacoa. Salsa de pepinillos. Mayonesa con bajo contenido de grasa o sin grasa. Grasas y aceites Aceite de oliva o de canola. Nueces y [place]. Los artculos mencionados arriba pueden no ser [employer]on de las bebidas o los alimentos recomendados. Comunquese con el nutricionista para conocer ms opciones. QU ALIMENTOS NO SE RECOMIENDAN? Alimentos con alto contenido de caloras o Holiday representative. Comidas fritas. Dulces. Los productos elaborados con harina blanca refinada, entre ellos, pan blanco, pasteles, arroz blanco y pastas. Los artculos mencionados arriba pueden no ser Ray[employer]on de las bebidas y los alimentos que se Theatre stage manager. Comunquese con el nutricionista para obtener ms informacin. Esta informacin no tiene  [employer]  fin reemplazar el consejo del mdico. Asegrese de hacerle al mdico cualquier pregunta que tenga. Document Released: 07/20/2015 Document Revised: 07/20/2015 Document Reviewed: 04/09/2014 Elsevier Interactive Patient Education  2018 [employer].

## 2018-03-05 ENCOUNTER — Ambulatory Visit: Payer: Self-pay | Admitting: Obstetrics & Gynecology

## 2018-03-05 NOTE — Progress Notes (Deleted)
   Patient did not show up today for her scheduled appointment.   Rey Fors, MD, FACOG Obstetrician & Gynecologist, [facility],  [facility]  

## 2018-05-03 ENCOUNTER — Ambulatory Visit: Payer: Self-pay

## 2018-06-11 ENCOUNTER — Encounter: Payer: Self-pay | Admitting: Family Medicine

## 2018-06-11 ENCOUNTER — Other Ambulatory Visit: Payer: Self-pay

## 2018-06-11 ENCOUNTER — Other Ambulatory Visit (HOSPITAL_COMMUNITY)
Admission: RE | Admit: 2018-06-11 | Discharge: 2018-06-11 | Disposition: A | Discharge status: Home / Self Care | Payer: Self-pay | Source: Ambulatory Visit | Attending: Family Medicine | Admitting: Family Medicine

## 2018-06-11 ENCOUNTER — Ambulatory Visit (INDEPENDENT_AMBULATORY_CARE_PROVIDER_SITE_OTHER): Payer: Self-pay | Admitting: Family Medicine

## 2018-06-11 VITALS — BP 102/68 | HR 66 | Temp 98.2°F | Ht 62.0 in | Wt 156.4 lb

## 2018-06-11 DIAGNOSIS — Z13220 Encounter for screening for lipoid disorders: Secondary | ICD-10-CM

## 2018-06-11 DIAGNOSIS — Z124 Encounter for screening for malignant neoplasm of cervix: Secondary | ICD-10-CM

## 2018-06-11 DIAGNOSIS — N889 Noninflammatory disorder of cervix uteri, unspecified: Secondary | ICD-10-CM | POA: Insufficient documentation

## 2018-06-11 DIAGNOSIS — N926 Irregular menstruation, unspecified: Secondary | ICD-10-CM

## 2018-06-11 DIAGNOSIS — Z23 Encounter for immunization: Secondary | ICD-10-CM

## 2018-06-11 NOTE — Assessment & Plan Note (Signed)
Instructed patient to follow up with GYN for her PCOS and oligomenorrhea (periods only twice a year). Also advised her to have them examine her cervix given the nodularity I discovered today on exam.

## 2018-06-11 NOTE — Assessment & Plan Note (Signed)
Needs to follow up with GYN; having periods only twice a year. Provided patient with phone # so she can schedule with GYN.

## 2018-06-11 NOTE — Progress Notes (Signed)
Date of Visit: 06/11/2018   HPI:  Patient presents today for a well woman exam.   Concerns today: none Periods: gets periods about twice a year. Previously saw Dr. Macon Large for infertility related to PCOS, given rx for metformin but has not followed up  Contraception: none, trying to get pregnant Pelvic symptoms: left sided pelvic pain once per month, no pain currently Sexual activity: yes STD Screening: declines STD screening today Pap smear status: doing today Exercise: no Diet: tries to eat healthy Smoking: no Alcohol: occasional Drugs: no Mood: no concerns Dentist: yes  ROS: See HPI  PMFSH:  Cancers in family: none  PHYSICAL EXAM: BP 102/68   Pulse 66   Temp 98.2 F (36.8 C) (Oral)   Ht 5\' 2"  (1.575 m)   Wt 156 lb 6.4 oz (70.9 kg)   SpO2 98%   BMI 28.61 kg/m  Gen: NAD, pleasant, cooperative HEENT: NCAT, PERRL, no palpable thyromegaly or anterior cervical lymphadenopathy Heart: RRR, no murmurs Lungs: CTAB, NWOB Abdomen: soft, nontender to palpation Neuro: grossly nonfocal, speech normal GU: normal appearing external genitalia without lesions. Vagina is moist with white discharge. Anterior lip of cervix is almost one large nodule, with large ectropion present. Mild friability. No cervical motion tenderness or tenderness on bimanual exam. No adnexal masses.   ASSESSMENT/PLAN:  Health maintenance:  -STD screening: declines today -pap smear: done today -lipid screening: return for fasting lab visit to check cholesterol at patient request -immunizations: flu shot today -handout given on health maintenance topics  MENSTRUAL CYCLE, IRREGULAR Needs to follow up with GYN; having periods only twice a year. Provided patient with phone # so she can schedule with GYN.  Cervix abnormality Instructed patient to follow up with GYN for her PCOS and oligomenorrhea (periods only twice a year). Also advised her to have them examine her cervix given the nodularity I discovered  today on exam.   FOLLOW UP: Follow up in 1 year for next physical Schedule lab visit Schedule with GYN  [place] J. Pollie Meyer, MD [facility]

## 2018-06-11 NOTE — Patient Instructions (Addendum)
Call to schedule an appointment with the gynecology office at [phone]  Make sure they look at your cervix when you go there  Flu shot today  On your way out, schedule an appointment one morning to come back for fasting labs. Do not eat or drink anything other than water the morning of your lab appointment until after your labs are drawn.   Will call or send letter with pap results  Be well, Dr. Ardelia Mems   Health Maintenance, Female Adopting a healthy lifestyle and getting preventive care can go a long way to promote health and wellness. Talk with your health care provider about what schedule of regular examinations is right for you. This is a good chance for you to check in with your provider about disease prevention and staying healthy. In between checkups, there are plenty of things you can do on your own. Experts have done a lot of research about which lifestyle changes and preventive measures are most likely to keep you healthy. Ask your health care provider for more information. Weight and diet Eat a healthy diet  Be sure to include plenty of vegetables, fruits, low-fat dairy products, and lean protein.  Do not eat a lot of foods high in solid fats, added sugars, or salt.  Get regular exercise. This is one of the most important things you can do for your health. ? Most adults should exercise for at least 150 minutes each week. The exercise should increase your heart rate and make you sweat (moderate-intensity exercise). ? Most adults should also do strengthening exercises at least twice a week. This is in addition to the moderate-intensity exercise. Maintain a healthy weight  Body mass index (BMI) is a measurement that can be used to identify possible weight problems. It estimates body fat based on height and weight. Your health care provider can help determine your BMI and help you achieve or maintain a healthy weight.  For females 55 years of age and older: ? A BMI below  18.5 is considered underweight. ? A BMI of 18.5 to 24.9 is normal. ? A BMI of 25 to 29.9 is considered overweight. ? A BMI of 30 and above is considered obese. Watch levels of cholesterol and blood lipids  You should start having your blood tested for lipids and cholesterol at 30 years of age, then have this test every 5 years.  You may need to have your cholesterol levels checked more often if: ? Your lipid or cholesterol levels are high. ? You are older than 30 years of age. ? You are at high risk for heart disease. Cancer screening Lung Cancer  Lung cancer screening is recommended for adults 4-49 years old who are at high risk for lung cancer because of a history of smoking.  A yearly low-dose CT scan of the lungs is recommended for people who: ? Currently smoke. ? Have quit within the past 15 years. ? Have at least a 30-pack-year history of smoking. A pack year is smoking an average of one pack of cigarettes a day for 1 year.  Yearly screening should continue until it has been 15 years since you quit.  Yearly screening should stop if you develop a health problem that would prevent you from having lung cancer treatment. Breast Cancer  Practice breast self-awareness. This means understanding how your breasts normally appear and feel.  It also means doing regular breast self-exams. Let your health care provider know about any changes, no matter how small.  If  you are in your 20s or 30s, you should have a clinical breast exam (CBE) by a health care provider every 1-3 years as part of a regular health exam.  If you are 69 or older, have a CBE every year. Also consider having a breast X-ray (mammogram) every year.  If you have a family history of breast cancer, talk to your health care provider about genetic screening.  If you are at high risk for breast cancer, talk to your health care provider about having an MRI and a mammogram every year.  Breast cancer gene (BRCA)  assessment is recommended for women who have family members with BRCA-related cancers. BRCA-related cancers include: ? Breast. ? Ovarian. ? Tubal. ? Peritoneal cancers.  Results of the assessment will determine the need for genetic counseling and BRCA1 and BRCA2 testing. Cervical Cancer Your health care provider may recommend that you be screened regularly for cancer of the pelvic organs (ovaries, uterus, and vagina). This screening involves a pelvic examination, including checking for microscopic changes to the surface of your cervix (Pap test). You may be encouraged to have this screening done every 3 years, beginning at age 25.  For women ages 60-65, health care providers may recommend pelvic exams and Pap testing every 3 years, or they may recommend the Pap and pelvic exam, combined with testing for human papilloma virus (HPV), every 5 years. Some types of HPV increase your risk of cervical cancer. Testing for HPV may also be done on women of any age with unclear Pap test results.  Other health care providers may not recommend any screening for nonpregnant women who are considered low risk for pelvic cancer and who do not have symptoms. Ask your health care provider if a screening pelvic exam is right for you.  If you have had past treatment for cervical cancer or a condition that could lead to cancer, you need Pap tests and screening for cancer for at least 20 years after your treatment. If Pap tests have been discontinued, your risk factors (such as having a new sexual partner) need to be reassessed to determine if screening should resume. Some women have medical problems that increase the chance of getting cervical cancer. In these cases, your health care provider may recommend more frequent screening and Pap tests. Colorectal Cancer  This type of cancer can be detected and often prevented.  Routine colorectal cancer screening usually begins at 30 years of age and continues through 30 years  of age.  Your health care provider may recommend screening at an earlier age if you have risk factors for colon cancer.  Your health care provider may also recommend using home test kits to check for hidden blood in the stool.  A small camera at the end of a tube can be used to examine your colon directly (sigmoidoscopy or colonoscopy). This is done to check for the earliest forms of colorectal cancer.  Routine screening usually begins at age 80.  Direct examination of the colon should be repeated every 5-10 years through 30 years of age. However, you may need to be screened more often if early forms of precancerous polyps or small growths are found. Skin Cancer  Check your skin from head to toe regularly.  Tell your health care provider about any new moles or changes in moles, especially if there is a change in a mole's shape or color.  Also tell your health care provider if you have a mole that is larger than the size  of a pencil eraser.  Always use sunscreen. Apply sunscreen liberally and repeatedly throughout the day.  Protect yourself by wearing long sleeves, pants, a wide-brimmed hat, and sunglasses whenever you are outside. Heart disease, diabetes, and high blood pressure  High blood pressure causes heart disease and increases the risk of stroke. High blood pressure is more likely to develop in: ? People who have blood pressure in the high end of the normal range (130-139/85-89 mm Hg). ? People who are overweight or obese. ? People who are African American.  If you are 61-73 years of age, have your blood pressure checked every 3-5 years. If you are 5 years of age or older, have your blood pressure checked every year. You should have your blood pressure measured twice-once when you are at a hospital or clinic, and once when you are not at a hospital or clinic. Record the average of the two measurements. To check your blood pressure when you are not at a hospital or clinic, you can  use: ? An automated blood pressure machine at a pharmacy. ? A home blood pressure monitor.  If you are between 57 years and 72 years old, ask your health care provider if you should take aspirin to prevent strokes.  Have regular diabetes screenings. This involves taking a blood sample to check your fasting blood sugar level. ? If you are at a normal weight and have a low risk for diabetes, have this test once every three years after 30 years of age. ? If you are overweight and have a high risk for diabetes, consider being tested at a younger age or more often. Preventing infection Hepatitis B  If you have a higher risk for hepatitis B, you should be screened for this virus. You are considered at high risk for hepatitis B if: ? You were born in a country where hepatitis B is common. Ask your health care provider which countries are considered high risk. ? Your parents were born in a high-risk country, and you have not been immunized against hepatitis B (hepatitis B vaccine). ? You have HIV or AIDS. ? You use needles to inject street drugs. ? You live with someone who has hepatitis B. ? You have had sex with someone who has hepatitis B. ? You get hemodialysis treatment. ? You take certain medicines for conditions, including cancer, organ transplantation, and autoimmune conditions. Hepatitis C  Blood testing is recommended for: ? Everyone born from [birth year] through 1967. ? Anyone with known risk factors for hepatitis C. Sexually transmitted infections (STIs)  You should be screened for sexually transmitted infections (STIs) including gonorrhea and chlamydia if: ? You are sexually active and are younger than 30 years of age. ? You are older than 30 years of age and your health care provider tells you that you are at risk for this type of infection. ? Your sexual activity has changed since you were last screened and you are at an increased risk for chlamydia or gonorrhea. Ask your health care  provider if you are at risk.  If you do not have HIV, but are at risk, it may be recommended that you take a prescription medicine daily to prevent HIV infection. This is called pre-exposure prophylaxis (PrEP). You are considered at risk if: ? You are sexually active and do not regularly use condoms or know the HIV status of your partner(s). ? You take drugs by injection. ? You are sexually active with a partner who has HIV. Talk  with your health care provider about whether you are at high risk of being infected with HIV. If you choose to begin PrEP, you should first be tested for HIV. You should then be tested every 3 months for as long as you are taking PrEP. Pregnancy  If you are premenopausal and you may become pregnant, ask your health care provider about preconception counseling.  If you may become pregnant, take 400 to 800 micrograms (mcg) of folic acid every day.  If you want to prevent pregnancy, talk to your health care provider about birth control (contraception). Osteoporosis and menopause  Osteoporosis is a disease in which the bones lose minerals and strength with aging. This can result in serious bone fractures. Your risk for osteoporosis can be identified using a bone density scan.  If you are 30 years of age or older, or if you are at risk for osteoporosis and fractures, ask your health care provider if you should be screened.  Ask your health care provider whether you should take a calcium or vitamin D supplement to lower your risk for osteoporosis.  Menopause may have certain physical symptoms and risks.  Hormone replacement therapy may reduce some of these symptoms and risks. Talk to your health care provider about whether hormone replacement therapy is right for you. Follow these instructions at home:  Schedule regular health, dental, and eye exams.  Stay current with your immunizations.  Do not use any tobacco products including cigarettes, chewing tobacco, or  electronic cigarettes.  If you are pregnant, do not drink alcohol.  If you are breastfeeding, limit how much and how often you drink alcohol.  Limit alcohol intake to no more than 1 drink per day for nonpregnant women. One drink equals 12 ounces of beer, 5 ounces of wine, or 1 ounces of hard liquor.  Do not use street drugs.  Do not share needles.  Ask your health care provider for help if you need support or information about quitting drugs.  Tell your health care provider if you often feel depressed.  Tell your health care provider if you have ever been abused or do not feel safe at home. This information is not intended to replace advice given to you by your health care provider. Make sure you discuss any questions you have with your health care provider. Document Released: 10/11/2010 Document Revised: 09/03/2015 Document Reviewed: 12/30/2014 Elsevier Interactive Patient Education  2019 [employer].

## 2018-06-12 ENCOUNTER — Other Ambulatory Visit: Payer: Self-pay

## 2018-06-12 DIAGNOSIS — Z13220 Encounter for screening for lipoid disorders: Secondary | ICD-10-CM

## 2018-06-13 LAB — LIPID PANEL
Chol/HDL Ratio: 3.5 ratio (ref 0.0–4.4)
Cholesterol, Total: 175 mg/dL (ref 100–199)
HDL: 50 mg/dL (ref >39)
LDL Calculated: 101 mg/dL — ABNORMAL HIGH (ref 0–99)
Triglycerides: 122 mg/dL (ref 0–149)
VLDL Cholesterol Cal: 24 mg/dL (ref 5–40)

## 2018-06-13 LAB — CMP14+EGFR
ALT: 20 IU/L (ref 0–32)
AST: 19 IU/L (ref 0–40)
Albumin/Globulin Ratio: 1.6 (ref 1.2–2.2)
Albumin: 4.7 g/dL (ref 3.9–5.0)
Alkaline Phosphatase: 96 IU/L (ref 39–117)
BUN/Creatinine Ratio: 20 (ref 9–23)
BUN: 13 mg/dL (ref 6–20)
Bilirubin Total: 0.4 mg/dL (ref 0.0–1.2)
CO2: 21 mmol/L (ref 20–29)
Calcium: 9.6 mg/dL (ref 8.7–10.2)
Chloride: 103 mmol/L (ref 96–106)
Creatinine, Ser: 0.66 mg/dL (ref 0.57–1.00)
GFR calc Af Amer: 138 mL/min/{1.73_m2} (ref >59)
GFR calc non Af Amer: 120 mL/min/{1.73_m2} (ref >59)
Globulin, Total: 2.9 g/dL (ref 1.5–4.5)
Glucose: 96 mg/dL (ref 65–99)
Potassium: 4.4 mmol/L (ref 3.5–5.2)
Sodium: 141 mmol/L (ref 134–144)
Total Protein: 7.6 g/dL (ref 6.0–8.5)

## 2018-06-15 ENCOUNTER — Encounter: Payer: Self-pay | Admitting: Family Medicine

## 2018-06-15 LAB — CYTOLOGY - PAP
Diagnosis: UNDETERMINED — AB
HPV (WINDOPATH): NOT DETECTED

## 2018-06-20 ENCOUNTER — Encounter: Payer: Self-pay | Admitting: Family Medicine

## 2018-06-20 ENCOUNTER — Encounter: Payer: Self-pay | Admitting: Obstetrics & Gynecology

## 2018-06-20 ENCOUNTER — Ambulatory Visit (INDEPENDENT_AMBULATORY_CARE_PROVIDER_SITE_OTHER): Payer: Self-pay | Admitting: Obstetrics & Gynecology

## 2018-06-20 ENCOUNTER — Other Ambulatory Visit: Payer: Self-pay

## 2018-06-20 VITALS — BP 122/82 | HR 66 | Wt 156.4 lb

## 2018-06-20 DIAGNOSIS — N889 Noninflammatory disorder of cervix uteri, unspecified: Secondary | ICD-10-CM

## 2018-06-20 DIAGNOSIS — E282 Polycystic ovarian syndrome: Secondary | ICD-10-CM

## 2018-06-20 DIAGNOSIS — N970 Female infertility associated with anovulation: Secondary | ICD-10-CM

## 2018-06-20 MED ORDER — METFORMIN HCL 500 MG PO TABS: ORAL_TABLET | ORAL | 5 refills | Status: DC

## 2018-06-20 NOTE — Progress Notes (Signed)
GYNECOLOGY OFFICE VISIT NOTE   History:  Traci Porter is a 28 y.o. G1P0 here today for evaluation of possible cervical nodulle seen by PCP during annual exam last week, and for PCOS follow up. Was last seen here in 01/2018, prescribed Metformin for PCOS and related infertility, and told to have husband get semen analysis prior to Clomid prescription.  She did not continue Metformin or have do semen analysis. LMP last month. She denies any abnormal vaginal discharge, bleeding, pelvic pain or other concerns.    Past Medical History:  Diagnosis Date   Chronic constipation    Infertility associated with anovulation    PCOS (polycystic ovarian syndrome) 03/08/2016   Ultrasound 03/01/2016 IMPRESSION: 1. No acute findings identified 2. Increased volume of both ovaries. In the absence of oral contraceptive therapy findings meet diagnostic criteria for polycystic ovarian syndrome (PCOS). This is Per 2003 consensus conference statement by the European Society for Human Reproduction and Embryology and the American Society for Reproductive Medicine    Past Surgical History:  Procedure Laterality Date   implanon removed  03/2009    The following portions of the patient's history were reviewed and updated as appropriate: allergies, current medications, past family history, past medical history, past social history, past surgical history and problem list.   Health Maintenance:  ASCUS pap and negative HRHPV on 06/14/2018.    Review of Systems:  Pertinent items noted in HPI and remainder of comprehensive ROS otherwise negative.  Physical Exam:  BP 122/82    Pulse 66    Wt 156 lb 6.4 oz (70.9 kg)    LMP 05/16/2018 (Exact Date)    BMI 28.61 kg/m  CONSTITUTIONAL: Well-developed, well-nourished female in no acute distress.  HEENT:  Normocephalic, atraumatic. External right and left ear normal. No scleral icterus.  NECK: Normal range of motion, supple, no masses noted on observation SKIN: No  rash noted. Not diaphoretic. No erythema. No pallor. MUSCULOSKELETAL: Normal range of motion. No edema noted. NEUROLOGIC: Alert and oriented to person, place, and time. Normal muscle tone coordination. No cranial nerve deficit noted. PSYCHIATRIC: Normal mood and affect. Normal behavior. Normal judgment and thought content. CARDIOVASCULAR: Normal heart rate noted RESPIRATORY: Effort and breath sounds normal, no problems with respiration noted ABDOMEN: No masses noted. No other overt distention noted.   PELVIC: Normal appearing external genitalia; normal appearing vaginal mucosa and cervix.  Cervix with prominent anterior lip but no nodule noted, this is a normal variant. No Nabothian cyst or other anomaly.  No abnormal discharge noted.       Assessment and Plan:     1. Abnormal cervix finding Normal exam finding of a prominent anterior cervical lip. Patient reassured.  2. PCOS (polycystic ovarian syndrome) 3. Infertility associated with anovulation Re-counseled patient about management of PCOS, recommended weight loss which helps with restoring ovulatory cycles, decreases glucose intolerance with improvement of metabolic risk, improves fertility/pregnancy rates and helps with overall health.  Even modest weight loss (5 to 10 percent reduction in body weight) in women with PCOS may result in these effects.   PCOS is also treated with Metformin given its association with glucose intolerance and insulin resistance. Over 50% of PCOS patients on 1540m of Metformin daily have been shown to ovulate successfully. Common side effects include GI intolerance, kidney and liver enzyme irregularities, lactic acidosis.Normal LFTs and Cr in chart. She was prescribed Metformin 500 mg po daily x 1 week, then bid x 1 week, then tid for one week.  If she tolerates this, she can then be switched to 850 mg po bid or 1000 mg po bid. Will continue to follow.  Patient will return in 1 month for followup and repeat CMET  check.   The patient was presented with management with clomiphene citrate (Clomid) in addition to Metformin to maximize chances of conception. But prior to giving her this agent which could have a myriad of side effects, her husband will need to repeat semen analysis.  Of note, some experts now suggest letrozole over clomiphene for ovulation induction in PCOS patients, but not in patients with a BMI <30 kg/m2 . Current Body mass index is 28.61 kg/m.  Patient was given the option to be referred to a Reproductive Endocrinology and Infertility Specialist now or at any time for further evaluation, she declines referral for now due to cost. Was told to call these offices to set up for husband to the semen analysis. - metFORMIN (GLUCOPHAGE) 500 MG tablet; Toma una por dia por una semana. Despues, toma una en la [employer] y [place] en la trade por Regulatory affairs officer. Si la toleras, toma una tres veces por dia  Dispense: 90 tablet; Refill: 5   Routine preventative health maintenance measures emphasized. Please refer to After Visit Summary for other counseling recommendations.   Return in about 2 months (around 08/20/2018) for Followup PCOS, check labs.    Total face-to-face time with patient: 15 minutes.  Over 50% of encounter was spent on counseling and coordination of care.   Verita Schneiders, MD, [place] for [employer], [place]

## 2018-06-20 NOTE — Patient Instructions (Signed)
Sndrome ovrico poliqustico (Polycystic Ovarian Syndrome) El sndrome ovrico poliqustico (PCOS) es un trastorno hormonal comn en mujeres en edad reproductiva. La mayora de las mujeres con PCOS tienen pequeos sacos llenos de lquido (quistes) que crecen en los ovarios y [place] no son parte del ciclo normal de Building control surveyor. Esto puede ocasionar problemas con los perodos menstruales y dificultades para quedar embarazada. Tambin puede aumentar el riesgo de aborto espontneo. Si no se trata, el sndrome ovrico poliqustico puede acarrear graves problemas de [place], como diabetes y enfermedades del Programmer, multimedia. CAUSAS Se desconoce la causa del sndrome ovrico poliqustico, aunque puede ser la combinacin de ciertos factores, por ejemplo:  Perodos menstruales irregulares.  Niveles altos de ciertas hormonas (andrgenos).  Problemas con las hormonas que ayudan a Chief Operating Officer la glucemia (resistencia a la insulina).  Ciertos genes. FACTORES DE RIESGO Es ms probable que esta afeccin se produzca en mujeres con antecedentes familiares de PCOS. SNTOMAS Los sntomas del PCOS pueden incluir lo siguiente:  Mltiples quistes ovricos.  Perodos menstruales irregulares o ausentes.  Perodos menstruales que son muy frecuentes o demasiado intensos.  Perodos menstruales impredecibles.  Incapacidad para quedar embarazada (infertilidad) debido a la falta de ovulacin.  Aumento del crecimiento del vello en el rostro, el trax, el [address], la espalda, los muslos o los dedos de los pies.  Piel grasa o acn. El acn se puede desarrollar durante la [place] y es posible que no responda a Pharmacist, community.  Dolor en la pelvis.  Sobrepeso u obesidad.  Manchas de piel gruesa marrn oscura o negra en el cuello, brazos, pechos o caderas (acanthosis nigricans).  Exceso de crecimiento de vello en el rostro, el pecho, el abdomen o la parte superior de las caderas (hirsutismo). DIAGNSTICO Esta afeccin se  diagnostica en funcin de lo siguiente:  Sus antecedentes mdicos.  Un examen fsico, incluido un examen plvico. El mdico puede buscar las zonas con crecimiento de vello sobre la piel.  Estudios, por ejemplo: ? Ecografa. Esto puede utilizarse para [employer] ovarios y las paredes del tero (endometrio) en busca de quistes. ? Anlisis de [place]. Estos se pueden Chemical engineer para Glass blower/designer [employer] niveles de azcar (glucosa), las hormonas masculinas (testosterona) y las hormonas femeninas (estrgeno) y (progesterona). TRATAMIENTO No hay cura para el sndrome ovrico poliqustico, pero un tratamiento puede ayudar a Chief Operating Officer los sntomas y Automotive engineer que se desarrollen otros problemas de [place]. Los tratamientos varan en funcin de lo siguiente:  Sus sntomas.  Si desea tener un beb o usar un mtodo de control de la natalidad (anticonceptivos). El tratamiento puede incluir cambios en la dieta y el estilo de vida junto con:  Hormona progesterona, para iniciar un perodo menstrual.  Pldoras anticonceptivas para Orthoptist.  Medicamentos para estimular la ovulacin, si quiere quedar embarazada.  Medicamentos para reducir el crecimiento excesivo de vello.  Kandis Ban, solo [employer] son graves. Esta puede consistir en hacer pequeos orificios en uno o ambos ovarios. Esto disminuye la cantidad de testosterona que produce el cuerpo. INSTRUCCIONES PARA EL CUIDADO EN EL HOGAR  Tome los medicamentos de venta libre y los recetados solamente como se lo haya indicado el mdico.  Siga un plan de alimentacin saludable. Esto puede ayudar a Web designer del sndrome ovrico poliqustico. ? Un plan de alimentacin saludable incluye consumir protenas magras, hidratos de carbono complejos, frutas y verduras frescas, productos lcteos con bajo contenido de [place] y grasas saludables. Asegrese de Teacher, music.  Si tiene sobrepeso, baje de [place]  ovrico poliqustico.  ? Un plan de alimentacin saludable incluye consumir protenas magras, hidratos de carbono complejos, frutas y verduras frescas, productos lcteos con bajo contenido de grasa y grasas saludables. Asegrese de comer suficiente fibra.   Si tiene sobrepeso, baje de peso como se lo  haya indicado el mdico.  ? Perder el 10% del peso corporal puede mejorar los sntomas.  ? El mdico puede determinar cuntos kilos tiene que bajar y ayudarlo a que adelgace de manera segura.   Concurra a todas las visitas de control como se lo haya indicado el mdico. Esto es importante.  SOLICITE ATENCIN MDICA SI:   Los sntomas no mejoran con los medicamentos.   Presenta nuevos sntomas.  Esta informacin no tiene como fin reemplazar el consejo del mdico. Asegrese de hacerle al mdico cualquier pregunta que tenga.  Document Released: 07/14/2008 Document Revised: 01/16/2013 Document Reviewed: 09/13/2015  Elsevier Interactive Patient Education  2019 Elsevier Inc.

## 2018-07-03 ENCOUNTER — Telehealth: Payer: Self-pay | Admitting: Obstetrics & Gynecology

## 2018-07-03 NOTE — Telephone Encounter (Signed)
Patient called to get her Rx resent to [facility]. Provider sent it in Spanish, and they won't take it.

## 2018-07-03 NOTE — Telephone Encounter (Signed)
Patient called to say she needed to have her Rx sent to the [facility]  in [place]. She stated it was sent in Spanish.

## 2018-07-04 ENCOUNTER — Other Ambulatory Visit: Payer: Self-pay | Admitting: Obstetrics & Gynecology

## 2018-07-04 DIAGNOSIS — E282 Polycystic ovarian syndrome: Secondary | ICD-10-CM

## 2018-07-04 MED ORDER — METFORMIN HCL 1000 MG PO TABS: ORAL_TABLET | ORAL | 5 refills | Status: DC

## 2019-08-01 ENCOUNTER — Ambulatory Visit (INDEPENDENT_AMBULATORY_CARE_PROVIDER_SITE_OTHER): Payer: Self-pay | Admitting: Family Medicine

## 2019-08-01 ENCOUNTER — Other Ambulatory Visit: Payer: Self-pay

## 2019-08-01 ENCOUNTER — Ambulatory Visit (HOSPITAL_COMMUNITY)
Admission: RE | Admit: 2019-08-01 | Discharge: 2019-08-01 | Disposition: A | Discharge status: Home / Self Care | Payer: Self-pay | Source: Ambulatory Visit | Attending: Family Medicine | Admitting: Family Medicine

## 2019-08-01 VITALS — BP 120/70 | HR 72 | Ht 61.0 in | Wt 161.0 lb

## 2019-08-01 DIAGNOSIS — R079 Chest pain, unspecified: Secondary | ICD-10-CM | POA: Insufficient documentation

## 2019-08-01 DIAGNOSIS — R0789 Other chest pain: Secondary | ICD-10-CM

## 2019-08-01 DIAGNOSIS — Z0000 Encounter for general adult medical examination without abnormal findings: Secondary | ICD-10-CM

## 2019-08-01 DIAGNOSIS — G8929 Other chronic pain: Secondary | ICD-10-CM

## 2019-08-01 NOTE — Progress Notes (Signed)
Date of Visit: 08/01/2019   HPI:  Patient presents today for a well woman exam.   Concerns today: chest pain, see below Periods: gets every 1-2 months, becoming more regular Contraception: none, open to pregnancy STD Screening: declines today Pap smear status: last pap ascus with negative HPV last year - due 2023 for repeat Exercise: no, willing to start doing it more Smoking: no Alcohol: 1-2 shots of liquor per week Drugs: no Mood: no concerns Dentist: yes  Chest pain - has had chest pain for the last 2 weeks. Feels like pressure. No shortness of breath, nausea, sweating. Not worse with exertion (walking) but does note it is worth with reaching, twisting, stretching. 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 Has had similar pain in the past and says she got checked out and was told everything seemed fine.  ROS: See HPI  PMFSH:  Cancers in family: no  PHYSICAL EXAM: BP 120/70   Pulse 72   Ht 5\' 1"  (1.549 m)   Wt 161 lb (73 kg)   SpO2 98%   BMI 30.42 kg/m  Gen: NAD, pleasant, cooperative HEENT: NCAT, PERRL, no palpable thyromegaly or anterior cervical lymphadenopathy Heart: RRR, no murmurs. 2+ radial pulses bilaterally Chest: chest wall mildly tender to palpation and completely reproduces chest pain endorsed by patient.  Lungs: CTAB, NWOB Neuro: grossly nonfocal, speech normal Extremities: No appreciable lower extremity edema bilaterally   ASSESSMENT/PLAN:  Health maintenance:  -STD screening: declines today -pap smear: due for repeat 2023 -lipid screening: current -immunizations:  . COVID: encouraged COVID vaccination, reviewed what to expect, gave info on how to schedule -handout given on health maintenance topics  Chest wall pain, chronic Reproducible on exam. History & physical suggestive of musculoskeletal cause EKG without ischemic signs. Encouraged tylenol, ibuprofen, heating pad Follow up if not improving or if worsens   FOLLOW UP: Follow up in 1  year for next CPE  2024 J. [place], MD [facility]

## 2019-08-01 NOTE — Assessment & Plan Note (Signed)
Reproducible on exam. History & physical suggestive of musculoskeletal cause EKG without ischemic signs. Encouraged tylenol, ibuprofen, heating pad Follow up if not improving or if worsens

## 2019-08-01 NOTE — Patient Instructions (Signed)
Mantenimiento de la salud en las mujeres Health Maintenance, Female Adoptar un estilo de vida saludable y recibir atencin preventiva son importantes para promover la salud y el bienestar. Consulte al mdico sobre:  El esquema adecuado para hacerse pruebas y exmenes peridicos.  Cosas que puede hacer por su cuenta para prevenir enfermedades y mantenerse sana. Qu debo saber sobre la dieta, el peso y el ejercicio? Consuma una dieta saludable   Consuma una dieta que incluya muchas verduras, frutas, productos lcteos con bajo contenido de grasa y protenas magras.  No consuma muchos alimentos ricos en grasas slidas, azcares agregados o sodio. Mantenga un peso saludable El ndice de masa muscular (IMC) se utiliza para identificar problemas de peso. Proporciona una estimacin de la grasa corporal basndose en el peso y la altura. Su mdico puede ayudarle a determinar su IMC y a lograr o mantener un peso saludable. Haga ejercicio con regularidad Haga ejercicio con regularidad. Esta es una de las prcticas ms importantes que puede hacer por su salud. La mayora de los adultos deben seguir estas pautas:  Realizar, al menos, 150minutos de actividad fsica por semana. El ejercicio debe aumentar la frecuencia cardaca y hacerlo transpirar (ejercicio de intensidad moderada).  Hacer ejercicios de fortalecimiento por lo menos dos veces por semana. Agregue esto a su plan de ejercicio de intensidad moderada.  Pasar menos tiempo sentados. Incluso la actividad fsica ligera puede ser beneficiosa. Controle sus niveles de colesterol y lpidos en la sangre Comience a realizarse anlisis de lpidos y colesterol en la sangre a los 20aos y luego reptalos cada 5aos. Hgase controlar los niveles de colesterol con mayor frecuencia si:  Sus niveles de lpidos y colesterol son altos.  Es mayor de 40aos.  Presenta un alto riesgo de padecer enfermedades cardacas. Qu debo saber sobre las pruebas de  deteccin del cncer? Segn su historia clnica y sus antecedentes familiares, es posible que deba realizarse pruebas de deteccin del cncer en diferentes edades. Esto puede incluir pruebas de deteccin de lo siguiente:  Cncer de mama.  Cncer de cuello uterino.  Cncer colorrectal.  Cncer de piel.  Cncer de pulmn. Qu debo saber sobre la enfermedad cardaca, la diabetes y la hipertensin arterial? Presin arterial y enfermedad cardaca  La hipertensin arterial causa enfermedades cardacas y aumenta el riesgo de accidente cerebrovascular. Es ms probable que esto se manifieste en las personas que tienen lecturas de presin arterial alta, tienen ascendencia africana o tienen sobrepeso.  Hgase controlar la presin arterial: ? Cada 3 a 5 aos si tiene entre 18 y 39 aos. ? Todos los aos si es mayor de 40aos. Diabetes Realcese exmenes de deteccin de la diabetes con regularidad. Este anlisis revisa el nivel de azcar en la sangre en ayunas. Hgase las pruebas de deteccin:  Cada tresaos despus de los 40aos de edad si tiene un peso normal y un bajo riesgo de padecer diabetes.  Con ms frecuencia y a partir de una edad inferior si tiene sobrepeso o un alto riesgo de padecer diabetes. Qu debo saber sobre la prevencin de infecciones? Hepatitis B Si tiene un riesgo ms alto de contraer hepatitis B, debe someterse a un examen de deteccin de este virus. Hable con el mdico para averiguar si tiene riesgo de contraer la infeccin por hepatitis B. Hepatitis C Se recomienda el anlisis a:  Todos los que nacieron entre 1945 y 1965.  Todas las personas que tengan un riesgo de haber contrado hepatitis C. Enfermedades de transmisin sexual (ETS)  Hgase las   pruebas de deteccin de ITS, incluidas la gonorrea y la clamidia, si: ? Es sexualmente activa y es menor de 24aos. ? Es mayor de 24aos, y el mdico le informa que corre riesgo de tener este tipo de  infecciones. ? La actividad sexual ha cambiado desde que le hicieron la ltima prueba de deteccin y tiene un riesgo mayor de tener clamidia o gonorrea. Pregntele al mdico si usted tiene riesgo.  Pregntele al mdico si usted tiene un alto riesgo de contraer VIH. El mdico tambin puede recomendarle un medicamento recetado para ayudar a evitar la infeccin por el VIH. Si elige tomar medicamentos para prevenir el VIH, primero debe hacerse los anlisis de deteccin del VIH. Luego debe hacerse anlisis cada 3meses mientras est tomando los medicamentos. Embarazo  Si est por dejar de menstruar (fase premenopusica) y usted puede quedar embarazada, busque asesoramiento antes de quedar embarazada.  Tome de 400 a 800microgramos (mcg) de cido flico todos los das si queda embarazada.  Pida mtodos de control de la natalidad (anticonceptivos) si desea evitar un embarazo no deseado. Osteoporosis y menopausia La osteoporosis es una enfermedad en la que los huesos pierden los minerales y la fuerza por el avance de la edad. El resultado pueden ser fracturas en los huesos. Si tiene 65aos o ms, o si est en riesgo de sufrir osteoporosis y fracturas, pregunte a su mdico si debe:  Hacerse pruebas de deteccin de prdida sea.  Tomar un suplemento de calcio o de vitamina D para reducir el riesgo de fracturas.  Recibir terapia de reemplazo hormonal (TRH) para tratar los sntomas de la menopausia. Siga estas instrucciones en su casa: Estilo de vida  No consuma ningn producto que contenga nicotina o tabaco, como cigarrillos, cigarrillos electrnicos y tabaco de mascar. Si necesita ayuda para dejar de fumar, consulte al mdico.  No consuma drogas.  No comparta agujas.  Solicite ayuda a su mdico si necesita apoyo o informacin para abandonar las drogas. Consumo de alcohol  No beba alcohol si: ? Su mdico le indica no hacerlo. ? Est embarazada, puede estar embarazada o est tratando de quedar  embarazada.  Si bebe alcohol: ? Limite la cantidad que consume de 0 a 1 medida por da. ? Limite la ingesta si est amamantando.  Est atento a la cantidad de alcohol que hay en las bebidas que toma. En los [place], una medida equivale a una botella de cerveza de 12oz (355ml), un vaso de vino de 5oz (148ml) o un vaso de una bebida alcohlica de alta graduacin de 1oz (44ml). Instrucciones generales  Realcese los estudios de rutina de la salud, dentales y de la vista.  Mantngase al da con las vacunas.  Infrmele a su mdico si: ? Se siente deprimida con frecuencia. ? Alguna vez ha sido vctima de maltrato o no se siente segura en su casa. Resumen  Adoptar un estilo de vida saludable y recibir atencin preventiva son importantes para promover la salud y el bienestar.  Siga las instrucciones del mdico acerca de una dieta saludable, el ejercicio y la realizacin de pruebas o exmenes para detectar enfermedades.  Siga las instrucciones del mdico con respecto al control del colesterol y la presin arterial. Esta informacin no tiene como fin reemplazar el consejo del mdico. Asegrese de hacerle al mdico cualquier pregunta que tenga. Document Revised: 04/18/2018 Document Reviewed: 04/18/2018 Elsevier Patient Education  2020 Elsevier Inc.  

## 2020-04-11 HISTORY — PX: OTHER SURGICAL HISTORY: SHX169

## 2020-07-09 ENCOUNTER — Ambulatory Visit (INDEPENDENT_AMBULATORY_CARE_PROVIDER_SITE_OTHER): Payer: Self-pay | Admitting: Family Medicine

## 2020-07-09 ENCOUNTER — Other Ambulatory Visit: Payer: Self-pay

## 2020-07-09 VITALS — BP 110/76 | HR 79 | Wt 162.2 lb

## 2020-07-09 DIAGNOSIS — N6452 Nipple discharge: Secondary | ICD-10-CM

## 2020-07-09 NOTE — Assessment & Plan Note (Signed)
Patient presents with 3 weeks of right-sided focal breast pain, with a palpable mass in the area of tenderness.  Also with discharge for the past week in the right nipple.  Discharge today was tannish in color.  No family history of breast cancer.  Differential includes breast cancer, fibroadenoma, fibrocystic changes, nonlactating mastitis.  On physical exam does not appear to be cellulitis or abscess.  Advised to use Motrin and Tylenol for pain.  Will get mammogram and possible ultrasound of the breast.  We will follow up the patient after we get results.

## 2020-07-09 NOTE — Patient Instructions (Signed)
It was nice to see you today,  Because you are having the pain with the mass that I felt and the discharge as well, we will get a diagnostic mammogram to further evaluate your cause.  They may follow-up with an ultrasound after the mammogram.  I will call you with the results when I have them.  In the meantime, please take ibuprofen or Tylenol as needed for pain.  Please follow-up with your PCP in 1 month.  Have a great day,  Traci Bangs, MD

## 2020-07-09 NOTE — Progress Notes (Signed)
    SUBJECTIVE:   CHIEF COMPLAINT / HPI:   3 weeks of right breast pain localized lateral to her areola.  Pain has been constant.  1 week of discharge from that nipple.  States it is a brownish color.  Is able to reproduce the discharge with palpation.  She has also noticed a lump in the area of where it hurts.  Did not notice any lumps in her breast prior to this.  No family history of breast, ovarian, uterine cancer.  No fevers.  Patient is not currently breast-feeding.  PERTINENT  PMH / PSH:   OBJECTIVE:   BP 110/76   Pulse 79   Wt 162 lb 3.2 oz (73.6 kg)   LMP 06/29/2020   SpO2 98%   BMI 30.65 kg/m   General: Alert and oriented.  No acute distress. Breast: Breast roughly symmetrical in shape and size.  No abnormalities of the right nipple or areola.  Approximately 1.5 cm subcutaneous mass lateral to the right areola palpated.  Tenderness to palpation in this area.  Patient able to reproduce small amount of tan discharge from the right nipple.  No axillary lymphadenopathy.  ASSESSMENT/PLAN:   Nipple discharge Patient presents with 3 weeks of right-sided focal breast pain, with a palpable mass in the area of tenderness.  Also with discharge for the past week in the right nipple.  Discharge today was tannish in color.  No family history of breast cancer.  Differential includes breast cancer, fibroadenoma, fibrocystic changes, nonlactating mastitis.  On physical exam does not appear to be cellulitis or abscess.  Advised to use Motrin and Tylenol for pain.  Will get mammogram and possible ultrasound of the breast.  We will follow up the patient after we get results.     Sandre Kitty, MD [facility]

## 2020-07-10 ENCOUNTER — Other Ambulatory Visit: Payer: Self-pay | Admitting: Family Medicine

## 2020-07-10 DIAGNOSIS — N6452 Nipple discharge: Secondary | ICD-10-CM

## 2020-07-15 ENCOUNTER — Other Ambulatory Visit: Payer: Self-pay

## 2020-07-15 DIAGNOSIS — N6310 Unspecified lump in the right breast, unspecified quadrant: Secondary | ICD-10-CM

## 2020-07-30 ENCOUNTER — Other Ambulatory Visit: Payer: Self-pay | Admitting: Obstetrics and Gynecology

## 2020-07-30 ENCOUNTER — Other Ambulatory Visit: Payer: Self-pay

## 2020-07-30 ENCOUNTER — Ambulatory Visit
Admission: RE | Admit: 2020-07-30 | Discharge: 2020-07-30 | Disposition: A | Discharge status: Home / Self Care | Payer: No Typology Code available for payment source | Source: Ambulatory Visit | Attending: Obstetrics and Gynecology | Admitting: Obstetrics and Gynecology

## 2020-07-30 ENCOUNTER — Other Ambulatory Visit (HOSPITAL_COMMUNITY)
Admission: RE | Admit: 2020-07-30 | Discharge: 2020-07-30 | Disposition: A | Discharge status: Home / Self Care | Payer: No Typology Code available for payment source | Source: Ambulatory Visit | Attending: Obstetrics and Gynecology | Admitting: Obstetrics and Gynecology

## 2020-07-30 ENCOUNTER — Ambulatory Visit: Payer: Self-pay | Admitting: *Deleted

## 2020-07-30 VITALS — BP 130/84 | Wt 158.5 lb

## 2020-07-30 DIAGNOSIS — N6310 Unspecified lump in the right breast, unspecified quadrant: Secondary | ICD-10-CM

## 2020-07-30 DIAGNOSIS — Z1239 Encounter for other screening for malignant neoplasm of breast: Secondary | ICD-10-CM

## 2020-07-30 DIAGNOSIS — N6452 Nipple discharge: Secondary | ICD-10-CM

## 2020-07-30 DIAGNOSIS — N644 Mastodynia: Secondary | ICD-10-CM

## 2020-07-30 NOTE — Patient Instructions (Signed)
Explained breast self awareness Traci Porter. Patient did not need a Pap smear today due to last Pap smear was 06/11/2018. Per patients PCP next Pap smear due in 2023. Referred patient to the [facility] for a diagnostic mammogram. Appointment scheduled Thursday, July 30, 2020 at 1010. Patient aware of appointment and will be there. Let patient know will follow-up with her within the next week with results of her breast discharge by phone. Therma Alithea Lapage verbalized understanding.  Corinthian Kemler, Kathaleen Maser, RN 9:56 AM

## 2020-07-30 NOTE — Progress Notes (Signed)
[name] is a 30 y.o. female who presents to [facility] clinic today with complaint of right outer breast lump x one month and pain. Patient stated the pain is constant. Patient rates the pain at a 5 out of 10. Patient complained of a brownish right nipple discharge x 2 weeks that started spontaneous but not only happens when expresses.    Pap Smear: Pap smear not completed today. Last Pap smear was 06/11/2018 at [facility] and was abnormal - ASCUS with negative HPV. Per office visit notes with PCP on 08/01/2019 next Pap smear due in 2023. Per patient has no history of an abnormal Pap smear prior to her most recent Pap smear. Last Pap smear result is available in Epic.   Physical exam: Breasts Breasts symmetrical. No skin abnormalities bilateral breasts. No nipple retraction bilateral breasts. No nipple discharge left breast. Expressed a brownish colored discharge from the right breast. Sample of discharge sent to Cytology for evaluation. No lymphadenopathy. No lumps palpated left breast. Palpated a lump within the right breast between 8-9 o'clock 1 cm from the nipple. Complaints of right outer breast tenderness on exam.  Pelvic/Bimanual Pap is not indicated today per BCCCP guidelines.   Smoking History: Patient has never smoked.   Patient Navigation: Patient education provided. Access to services provided for patient through [place] program. Spanish interpreter Natale Lay from [facility] [facility] provided.   Breast and Cervical Cancer Risk Assessment: Patient does not have family history of breast cancer, known genetic mutations, or radiation treatment to the chest before age 17. Patient does not have history of cervical dysplasia, immunocompromised, or DES exposure in-utero. Breast cancer risk assessment completed. No breast cancer risk calculated due to patient is less than 46 years old.  Risk Assessment    Risk Scores      07/30/2020   Last edited by: Narda Rutherford,  LPN   5-year risk:    Lifetime risk:           A: BCCCP exam without pap smear Complaint of right breast lump, discharge, and pain.  P: Referred patient to the [facility] for a diagnostic mammogram. Appointment scheduled Thursday, July 30, 2020 at 1010.  Priscille Heidelberg, RN 07/30/2020 9:56 AM

## 2020-07-31 LAB — CYTOLOGY - NON PAP

## 2020-08-03 ENCOUNTER — Ambulatory Visit
Admission: RE | Admit: 2020-08-03 | Discharge: 2020-08-03 | Disposition: A | Discharge status: Home / Self Care | Payer: No Typology Code available for payment source | Source: Ambulatory Visit | Attending: Obstetrics and Gynecology | Admitting: Obstetrics and Gynecology

## 2020-08-03 ENCOUNTER — Other Ambulatory Visit: Payer: Self-pay

## 2020-08-03 ENCOUNTER — Telehealth: Payer: Self-pay

## 2020-08-03 DIAGNOSIS — N644 Mastodynia: Secondary | ICD-10-CM

## 2020-08-03 NOTE — Telephone Encounter (Signed)
Via Delorise Royals, Spanish Interpreter [facility]), Patient informed no malignant cells seen in right breast discharge. Patient verbalized understanding.

## 2020-08-19 ENCOUNTER — Other Ambulatory Visit: Payer: Self-pay

## 2021-02-09 ENCOUNTER — Ambulatory Visit (INDEPENDENT_AMBULATORY_CARE_PROVIDER_SITE_OTHER): Payer: Self-pay | Admitting: Family Medicine

## 2021-02-09 ENCOUNTER — Other Ambulatory Visit: Payer: Self-pay

## 2021-02-09 ENCOUNTER — Encounter: Payer: Self-pay | Admitting: Family Medicine

## 2021-02-09 ENCOUNTER — Other Ambulatory Visit (HOSPITAL_COMMUNITY)
Admission: RE | Admit: 2021-02-09 | Discharge: 2021-02-09 | Disposition: A | Discharge status: Home / Self Care | Payer: No Typology Code available for payment source | Source: Ambulatory Visit | Attending: Family Medicine | Admitting: Family Medicine

## 2021-02-09 VITALS — BP 119/73 | HR 70 | Ht 61.0 in | Wt 165.4 lb

## 2021-02-09 DIAGNOSIS — Z113 Encounter for screening for infections with a predominantly sexual mode of transmission: Secondary | ICD-10-CM

## 2021-02-09 DIAGNOSIS — Z124 Encounter for screening for malignant neoplasm of cervix: Secondary | ICD-10-CM | POA: Insufficient documentation

## 2021-02-09 DIAGNOSIS — E282 Polycystic ovarian syndrome: Secondary | ICD-10-CM

## 2021-02-09 DIAGNOSIS — Z0000 Encounter for general adult medical examination without abnormal findings: Secondary | ICD-10-CM

## 2021-02-09 MED ORDER — OLOPATADINE HCL 0.1 % OP SOLN: 1.0000 [drp] | Freq: Two times a day (BID) | OPHTHALMIC | 1 refills | Status: AC

## 2021-02-09 NOTE — Patient Instructions (Addendum)
It was great to see you again today!  Go see OB/GYN - call for an appointment Make sure they look at your cervix. Pap smear and testing today.  Please also schedule with an eye doctor. I sent in eye drops in case this is allergies but want you to see the eye doctor as well.  Be well, Dr. Pollie Meyer   Mantenimiento de la salud en las mujeres Health Maintenance, Female Adoptar un estilo de vida saludable y recibir atencin preventiva son importantes para promover la salud y Counsellor. Consulte al mdico sobre: El esquema adecuado para hacerse pruebas y exmenes peridicos. Cosas que puede hacer por su cuenta para prevenir enfermedades y [employer]. Qu debo saber sobre la dieta, el peso y el ejercicio? Consuma una dieta saludable  Consuma una dieta que incluya muchas verduras, frutas, productos lcteos con bajo contenido de [place] y Associate Professor. No consuma muchos alimentos ricos en grasas slidas, azcares agregados o sodio. Mantenga un peso saludable El ndice de masa muscular [facility]) se [place] para identificar problemas de [place]. Proporciona una estimacin de la grasa corporal basndose en el peso y la altura. Su mdico puede ayudarle a Engineer, site IMC y a Personnel officer o Pharmacologist un peso saludable. Haga ejercicio con regularidad Haga ejercicio con regularidad. Esta es una de las prcticas ms importantes que puede hacer por su salud. La [employer] de los adultos deben seguir estas pautas: Education officer, environmental, al menos, 150 minutos de actividad fsica por semana. El ejercicio debe aumentar la frecuencia cardaca y Media planner transpirar (ejercicio de intensidad moderada). Hacer ejercicios de fortalecimiento por lo [employer] por semana. Agregue esto a su plan de ejercicio de intensidad moderada. Pasar menos tiempo sentados. Incluso la actividad fsica ligera puede ser beneficiosa. Controle sus niveles de colesterol y lpidos en la sangre Comience a realizarse anlisis de lpidos y Oncologist en  la sangre a los 20 aos y luego reptalos cada 5 aos. Hgase controlar los niveles de colesterol con mayor frecuencia si: Sus niveles de lpidos y colesterol son altos. Es mayor de 40 aos. Presenta un alto riesgo de padecer enfermedades cardacas. Qu debo saber sobre las pruebas de deteccin del cncer? Segn su historia clnica y sus antecedentes familiares, es posible que deba realizarse pruebas de deteccin del cncer en diferentes edades. Esto puede incluir pruebas de deteccin de lo siguiente: Cncer de mama. Cncer de cuello uterino. Cncer colorrectal. Cncer de piel. Cncer de pulmn. Qu debo saber sobre la enfermedad cardaca, la diabetes y la hipertensin arterial? Presin arterial y enfermedad cardaca La hipertensin arterial causa enfermedades cardacas y [place] el riesgo de accidente cerebrovascular. Es ms probable que esto se manifieste en las personas que tienen lecturas de presin arterial alta, tienen ascendencia africana o tienen sobrepeso. Hgase controlar la presin arterial: Cada 3 a 5 aos si tiene entre 18 y 7 aos. Todos los aos si es mayor de 40 aos. Diabetes Realcese exmenes de deteccin de la diabetes con regularidad. Este anlisis revisa el nivel de azcar en la sangre en [place]. Hgase las pruebas de deteccin: Cada tres aos despus de los 40 aos de edad si tiene un peso normal y un bajo riesgo de padecer diabetes. Con ms frecuencia y a partir de [place] edad inferior si tiene sobrepeso o un alto riesgo de padecer diabetes. Qu debo saber sobre la prevencin de infecciones? Hepatitis B Si tiene un riesgo ms alto de contraer hepatitis B, debe someterse a un examen de deteccin de este virus. Hable con el  mdico para averiguar si tiene riesgo de contraer la infeccin por hepatitis B. Hepatitis C Se recomienda el anlisis a: [employer] 1945 y 1965. Todas las personas que tengan un riesgo de haber contrado hepatitis  C. Enfermedades de transmisin sexual (ETS) Hgase las pruebas de Airline pilot de ITS, incluidas la gonorrea y la clamidia, si: Es sexualmente activa y es menor de [address]. Es mayor de [address], y Public affairs consultant informa que corre riesgo de tener este tipo de infecciones. La actividad sexual ha cambiado desde que le hicieron la ltima prueba de deteccin y tiene un riesgo mayor de Warehouse manager clamidia o Copy. Pregntele al mdico si usted tiene riesgo. Pregntele al mdico si usted tiene un alto riesgo de Primary school teacher VIH. El mdico tambin puede recomendarle un medicamento recetado para ayudar a evitar la infeccin por el VIH. Si elige tomar medicamentos para prevenir el VIH, primero debe [employer] de deteccin del VIH. Luego debe hacerse anlisis cada 3 meses mientras est tomando los medicamentos. Embarazo Si est por dejar de Armed forces training and education officer (fase premenopusica) y usted puede quedar [place], busque asesoramiento antes de [place]. Tome de 400 a 800 microgramos (mcg) de cido [employer] si [place]. Pida mtodos de control de la natalidad (anticonceptivos) si desea evitar un embarazo no deseado. Osteoporosis y [place] La osteoporosis es una enfermedad en la que los huesos pierden los minerales y la fuerza por el avance de la edad. El resultado pueden ser fracturas en los [place]. Si tiene 65 aos o ms, o si est en riesgo de sufrir osteoporosis y fracturas, pregunte a su mdico si debe: Hacerse pruebas de deteccin de prdida sea. Tomar un suplemento de calcio o de vitamina D para reducir el riesgo de fracturas. Recibir terapia de reemplazo hormonal (TRH) para tratar los sntomas de la menopausia. Siga estas instrucciones en su casa: Estilo de vida No consuma ningn producto que contenga nicotina o tabaco, como cigarrillos, cigarrillos electrnicos y tabaco de Theatre manager. Si necesita ayuda para dejar de fumar, consulte al mdico. No consuma drogas. No comparta  agujas. Solicite ayuda a su mdico si necesita apoyo o informacin para abandonar las drogas. Consumo de alcohol No beba alcohol si: Su mdico le indica no hacerlo. Est embarazada, puede estar embarazada o est tratando de [place]. Si bebe alcohol: Limite la cantidad que consume de 0 a 1 medida por da. Limite la ingesta si est amamantando. Est atento a la cantidad de alcohol que hay en las bebidas que toma. En los [address], una medida equivale a una botella de cerveza de 12 oz (355 ml), un vaso de vino de 5 oz (148 ml) o un vaso de una bebida alcohlica de alta graduacin de 1 oz (44 ml). Instrucciones generales Realcese los estudios de rutina de la salud, dentales y de Wellsite geologist. Mantngase al da con las vacunas. Infrmele a su mdico si: Se siente deprimida con frecuencia. Alguna vez ha sido vctima de [employer] o no se siente segura en su casa. Resumen Adoptar un estilo de vida saludable y recibir atencin preventiva son importantes para promover la salud y Counsellor. Siga las instrucciones del mdico acerca de una dieta saludable, el ejercicio y la realizacin de pruebas o exmenes para Hotel manager. Siga las instrucciones del mdico con respecto al control del colesterol y la presin arterial. Esta informacin no tiene Theme park manager el consejo del mdico. Asegrese de hacerle al mdico cualquier pregunta que tenga. Document Revised:  04/18/2018 Document Reviewed: 04/18/2018 Elsevier Patient Education  2022 [employer].

## 2021-02-09 NOTE — Progress Notes (Signed)
  Date of Visit: 02/09/2021   SUBJECTIVE:   HPI:  Traci Porter presents today for a well woman exam.   Concerns today: desires pap smear, also irregular periods and R itchy watery eye Periods: gets period every 4-6 months. Painful periods with lots of clots.  Contraception: not on anything currently Pelvic symptoms: no pain or discharge Sexual activity: onemale partner  STD Screening: desires screening today Pap smear status: due March 2023, will do today Smoking: no Drugs: no Mood: no concerns  R eye has been occasionally red, itchy, watery. Occasionally blurry vision.  OBJECTIVE:   BP 119/73   Pulse 70   Ht 5\' 1"  (1.549 m)   Wt 165 lb 6.4 oz (75 kg)   SpO2 100%   BMI 31.25 kg/m  Visual acuity 20/20 Gen: NAD, pleasant, cooperative HEENT: NCAT, PERRL, no palpable thyromegaly or anterior cervical lymphadenopathy. No scleral injection or drainage from eyes. Heart: RRR, no murmurs Lungs: CTAB, NWOB Abdomen: soft, nontender to palpation Neuro: grossly nonfocal, speech normal GU: normal appearing external genitalia without lesions. Vagina is moist with white discharge. Cervix with slight white lesions around os, ?ectropion. No cervical motion tenderness or tenderness on bimanual exam. No adnexal masses.   ASSESSMENT/PLAN:   Health maintenance:  -STD screening: gc/chl/trich, HIV, RPR obtained today -pap smear: cotest done today. Given white-ish lesions around cervix (may be normal variant/ectropion) recommend patient be examined by OB/GYN since she will be going there for irregular periods -handout given on health maintenance topics  PCOS (polycystic ovarian syndrome) Previously took metformin without improvement in period irregularity Recommend patient see OB/GYN for further management, also to examine whitish lesions on cervix (may be normal variant of ectropion) Patient will call to schedule  R itchy eye Suspect allergic conjunctivitis Given report of blurry vision  recommend seeing eye doctor for assessment In the meantime rx for olapatadine drops sent in  J. [place], MD [facility]

## 2021-02-10 LAB — HIV ANTIBODY (ROUTINE TESTING W REFLEX): HIV Screen 4th Generation wRfx: NONREACTIVE

## 2021-02-10 LAB — RPR: RPR Ser Ql: NONREACTIVE

## 2021-02-11 ENCOUNTER — Encounter: Payer: Self-pay | Admitting: Family Medicine

## 2021-02-11 LAB — CYTOLOGY - PAP
Chlamydia: NEGATIVE
Comment: NEGATIVE
Comment: NEGATIVE
Comment: NEGATIVE
Comment: NORMAL
Diagnosis: NEGATIVE
High risk HPV: NEGATIVE
Neisseria Gonorrhea: NEGATIVE
Trichomonas: NEGATIVE

## 2021-02-14 NOTE — Assessment & Plan Note (Signed)
Previously took metformin without improvement in period irregularity Recommend patient see OB/GYN for further management, also to examine whitish lesions on cervix (may be normal variant of ectropion) Patient will call to schedule

## 2021-05-17 ENCOUNTER — Ambulatory Visit (INDEPENDENT_AMBULATORY_CARE_PROVIDER_SITE_OTHER): Payer: Self-pay | Admitting: Family Medicine

## 2021-05-17 ENCOUNTER — Other Ambulatory Visit: Payer: Self-pay

## 2021-05-17 ENCOUNTER — Encounter: Payer: Self-pay | Admitting: Family Medicine

## 2021-05-17 ENCOUNTER — Other Ambulatory Visit (HOSPITAL_COMMUNITY)
Admission: RE | Admit: 2021-05-17 | Discharge: 2021-05-17 | Disposition: A | Discharge status: Home / Self Care | Payer: No Typology Code available for payment source | Source: Ambulatory Visit | Attending: Family Medicine | Admitting: Family Medicine

## 2021-05-17 VITALS — BP 118/75 | HR 87 | Ht 61.0 in | Wt 161.2 lb

## 2021-05-17 DIAGNOSIS — R1030 Lower abdominal pain, unspecified: Secondary | ICD-10-CM

## 2021-05-17 DIAGNOSIS — Z124 Encounter for screening for malignant neoplasm of cervix: Secondary | ICD-10-CM | POA: Insufficient documentation

## 2021-05-17 DIAGNOSIS — K5900 Constipation, unspecified: Secondary | ICD-10-CM

## 2021-05-17 DIAGNOSIS — N898 Other specified noninflammatory disorders of vagina: Secondary | ICD-10-CM

## 2021-05-17 LAB — POCT URINALYSIS DIP (MANUAL ENTRY)
Bilirubin, UA: NEGATIVE
Blood, UA: NEGATIVE
Glucose, UA: NEGATIVE mg/dL
Ketones, POC UA: NEGATIVE mg/dL
Leukocytes, UA: NEGATIVE
Nitrite, UA: NEGATIVE
Protein Ur, POC: NEGATIVE mg/dL
Spec Grav, UA: 1.01 (ref 1.010–1.025)
Urobilinogen, UA: 0.2 E.U./dL
pH, UA: 6.5 (ref 5.0–8.0)

## 2021-05-17 LAB — POCT WET PREP (WET MOUNT)
Clue Cells Wet Prep Whiff POC: NEGATIVE
Trichomonas Wet Prep HPF POC: ABSENT

## 2021-05-17 LAB — POCT URINE PREGNANCY: Preg Test, Ur: NEGATIVE

## 2021-05-17 NOTE — Progress Notes (Deleted)
Vagin

## 2021-05-17 NOTE — Patient Instructions (Signed)
Thank you for coming to see me today. It was a pleasure. Today we talked about:   We will get some labs today.  If they are abnormal or we need to do something about them, I will call you.  If they are normal, I will send you a message on MyChart (if it is active) or a letter in the mail.  If you don't hear from [place] in 2 weeks, please call the office at the number below.   Will MyChart you the results of swabs today.  If needing treatment will send in prescription to your pharmacy.   Urine pregnancy test negative.  Take Miralax for your constipation  Please follow-up with PCP as needed  If you have any questions or concerns, please do not hesitate to call the office at [phone].  Best,   Carollee Leitz, MD

## 2021-05-17 NOTE — Progress Notes (Signed)
° ° °  SUBJECTIVE:   CHIEF COMPLAINT / HPI: vaginal discharge  Vaginal discharge for 2 weeks, thin and yellow in color.  Sexually active without condom use.  LMP 01/14.  Reports lower abdominal pain that started 2 days ago.  Denies any fevers, nausea, vomiting, diarrhea, foul smelling urine, abnormal vaginal bleeding.  Endorses urinary urgency and frequency and constipation.  Not taking Miralax currently.  PERTINENT  PMH / PSH:  Constipation  OBJECTIVE:   BP 118/75    Pulse 87    Ht 5\' 1"  (1.549 m)    Wt 161 lb 3.2 oz (73.1 kg)    LMP 04/24/2021 (Exact Date)    SpO2 100%    BMI 30.46 kg/m    General: Alert, no acute distress Cardio: Normal S1 and S2, RRR, no r/m/g Pulm: CTAB, normal work of breathing Abdomen: Bowel sounds normal. Abdomen soft and non-tender. Negative Rebound, McBurney's, Carnett's, Murphy's signs.  Pelvic Exam chaperoned by CMA Shari        External: normal female genitalia without lesions or masses        Vagina: normal without lesions or masses        Cervix: anterior cervical excoriation without lesions or masses         ASSESSMENT/PLAN:   Vaginal discharge  UPT negative Desires pregnancy Pap smear: performed Samples for Wet prep, GC/Chlamydia obtained HIV, RPR Follow up with results  Constipation Continue Miralax daily to regulate bowels Increase water and fiber intake Follow up with PCP if symptoms do not improve   Carollee Leitz, MD [place]

## 2021-05-18 LAB — HIV ANTIBODY (ROUTINE TESTING W REFLEX): HIV Screen 4th Generation wRfx: NONREACTIVE

## 2021-05-19 ENCOUNTER — Encounter: Payer: Self-pay | Admitting: Family Medicine

## 2021-05-19 LAB — CYTOLOGY - PAP
Chlamydia: NEGATIVE
Comment: NEGATIVE
Comment: NEGATIVE
Comment: NORMAL
Diagnosis: NEGATIVE
High risk HPV: NEGATIVE
Neisseria Gonorrhea: NEGATIVE

## 2021-05-19 LAB — RPR W/REFLEX TO TREPSURE

## 2021-05-20 ENCOUNTER — Encounter: Payer: Self-pay | Admitting: Family Medicine

## 2021-05-20 LAB — RPR W/REFLEX TO TREPSURE: RPR: NONREACTIVE

## 2021-05-20 LAB — T PALLIDUM ANTIBODY, EIA: T pallidum Antibody, EIA: NEGATIVE

## 2021-05-20 NOTE — Assessment & Plan Note (Signed)
Continue Miralax daily to regulate bowels Increase water and fiber intake Follow up with PCP if symptoms do not improve

## 2021-05-20 NOTE — Assessment & Plan Note (Addendum)
UPT negative Desires pregnancy Pap smear: performed Samples for Wet prep, GC/Chlamydia obtained HIV, RPR Follow up with results

## 2021-09-03 ENCOUNTER — Ambulatory Visit: Payer: Self-pay | Admitting: Internal Medicine

## 2021-09-03 ENCOUNTER — Encounter: Payer: Self-pay | Admitting: Internal Medicine

## 2021-09-03 VITALS — BP 116/72 | HR 72 | Resp 12 | Ht 61.5 in | Wt 163.0 lb

## 2021-09-03 DIAGNOSIS — Z23 Encounter for immunization: Secondary | ICD-10-CM

## 2021-09-03 DIAGNOSIS — N946 Dysmenorrhea, unspecified: Secondary | ICD-10-CM | POA: Insufficient documentation

## 2021-09-03 DIAGNOSIS — N920 Excessive and frequent menstruation with regular cycle: Secondary | ICD-10-CM

## 2021-09-03 DIAGNOSIS — H5789 Other specified disorders of eye and adnexa: Secondary | ICD-10-CM

## 2021-09-03 DIAGNOSIS — E010 Iodine-deficiency related diffuse (endemic) goiter: Secondary | ICD-10-CM

## 2021-09-03 DIAGNOSIS — H538 Other visual disturbances: Secondary | ICD-10-CM

## 2021-09-03 DIAGNOSIS — E282 Polycystic ovarian syndrome: Secondary | ICD-10-CM

## 2021-09-03 DIAGNOSIS — H11001 Unspecified pterygium of right eye: Secondary | ICD-10-CM

## 2021-09-03 DIAGNOSIS — L858 Other specified epidermal thickening: Secondary | ICD-10-CM

## 2021-09-03 MED ORDER — METFORMIN HCL 500 MG PO TABS: ORAL_TABLET | ORAL | 11 refills | Status: DC

## 2021-09-03 NOTE — Progress Notes (Signed)
Subjective:    Patient ID: Traci Porter, female   DOB: 1990-06-28, 31 y.o.   MRN: [id number]   HPI  Here to establish  Tildon Husky interprets  Blurry vision, mainly in right eye.  Notes a white spot on her eye as well.  States her left eye itches.  Has noted for about 4-5 months.  This is a new problem for her.   Both eyes are red at times, particularly with above symptoms.  Eyes water.   No history of allergies.  No nasal, throat or ear symptoms.   She does note at work where there is a lot of dust in the air where there are cosmetics and supplerments.   Has not tried using any sort of eye protection.  That is available for certain areas, but not in her area. No family history of eye issues, including glaucoma.   Has tried chamomile eyedrops without much improvement.  2.  PCOS:  Discussed this diagnosis.  Appears she was first diagnosed with U/S in 2017, but initiated on therapy for first time in 2020. Describes only taking Metformin for a few months and stopping.  Tolerated fine.  Sounds like she just did not understand the need to take chronically. She would like to get pregnant again.  Also discussed need to work on lifestyle changes to bring down weight with regards to PCOS and infertility.  3.  Dysmenorrhea:  also a chronic issue.  Has irregular, heavy, painful periods.  Periods were regular and without significant discomfort until had her 60 yo son.  Was having periods about every 4 months until about 4 months ago at which time became regular on a monthly basis and last only 3 days.  Still heavy and cramping.   Had implanon in around 2010, but only for 3 months as did not tolerate.  Has never utilized OCPs.   No hirsutism.  No significant acne.     4.  HM:  Has not had any COVID vaccinations.   Td out of date. No outpatient medications have been marked as taking for the 09/03/21 encounter (Office Visit) with Julieanne Manson, MD.   No Known Allergies  Past Medical  History:  Diagnosis Date   Chronic constipation    Infertility associated with anovulation    PCOS (polycystic ovarian syndrome) 03/08/2016   Ultrasound 03/01/2016 IMPRESSION: 1. No acute findings identified 2. Increased volume of both ovaries. In the absence of oral contraceptive therapy findings meet diagnostic criteria for polycystic ovarian syndrome (PCOS). This is Per 2003 consensus conference statement by the European Society for Human Reproduction and Embryology and the [employer]   Past Surgical History:  Procedure Laterality Date   implanon removed  03/11/2009   right breast cyst aspiration Right 2022   Family History  Problem Relation Age of Onset   Alcohol abuse Father        not clear if cirrhosis as cause of death   Family Status  Relation Name Status   Mother  Alive, age 65y   Father  Deceased       never really knew him as parents separated when she was an infant.   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 Lives female partner,  [place] and with Son Shari Prows (07/28/06)    Housewife   [place]   FOB not involved.  She has 5 brothers, one of which was killed by gang in [place] (1/08)                  Social Determinants of Health   Financial Resource Strain: Low Risk    Difficulty of Paying Living Expenses: Not hard at all  Food Insecurity: No Food Insecurity   Worried About Programme researcher, broadcasting/film/video in the Last Year: Never true   Ran Out of Food in the Last Year: Never true  Transportation Needs: No Transportation Needs   Lack of Transportation (Medical): No   Lack of Transportation (Non-Medical): No  Physical Activity: Not on file  Stress: Not on file  Social Connections: Not on file  Intimate Partner Violence: Not At Risk   Fear of Current or Ex-Partner: No   Emotionally Abused: No   Physically Abused: No   Sexually Abused: No     Review of Systems    Objective:   BP 116/72 (BP Location: Right Arm, Patient Position: Sitting, Cuff Size: Normal)   Pulse 72   Resp 12   Ht 5' 1.5" (1.562 m)   Wt 163 lb (73.9 kg)   LMP 08/31/2021 (Exact Date)   BMI 30.30 kg/m   Physical Exam NAD HEENT: PERRL, EOMI, pterygium involving nasal edge of right iris.  No injection of conjunctivae bilaterally.  Discs sharp.  TMs pearly gray, throat without injection Neck:  Supple, No adenopathy.  Thyroid a bit generous. Chest:  CTA CV:  RRR with normal S1 and S2, No S3, S4 or murmur.  Carotid, radial and DP pulses normal and equal Abd:  S, NT, No HSM or mass. + BS LE:  No edema Skin:  Perifollicular papular lesions, some mildly inflamed, mainly on outer upper arms.   Assessment & Plan    Eye irritation:  Ultimately, this seems due to exposure to dust in work place.  Letter to wear protective eyewear for work.  Referral to Optometry  2.  Right Pterygium: encouraged sunglasses when outside.  Optometry to follow.  3.  PCOS/mild obesity:  lifestyle changes.  Discussed long term use of Metformin as still interested  in pregnancy.  CMP, FLP  4.  Menorrhagia:  CBC.    5.  Mild thyromegaly:  TSH  6.  Keratosis pilaris:  Eucerin Cream for eczema relief twice daily, esp after shower.

## 2021-09-04 LAB — COMPREHENSIVE METABOLIC PANEL
ALT: 50 IU/L — ABNORMAL HIGH (ref 0–32)
AST: 36 IU/L (ref 0–40)
Albumin/Globulin Ratio: 1.4 (ref 1.2–2.2)
Albumin: 4.4 g/dL (ref 3.8–4.8)
Alkaline Phosphatase: 99 IU/L (ref 44–121)
BUN/Creatinine Ratio: 16 (ref 9–23)
BUN: 9 mg/dL (ref 6–20)
Bilirubin Total: 0.3 mg/dL (ref 0.0–1.2)
CO2: 23 mmol/L (ref 20–29)
Calcium: 8.8 mg/dL (ref 8.7–10.2)
Chloride: 103 mmol/L (ref 96–106)
Creatinine, Ser: 0.55 mg/dL — ABNORMAL LOW (ref 0.57–1.00)
Globulin, Total: 3.1 g/dL (ref 1.5–4.5)
Glucose: 90 mg/dL (ref 70–99)
Potassium: 4.3 mmol/L (ref 3.5–5.2)
Sodium: 139 mmol/L (ref 134–144)
Total Protein: 7.5 g/dL (ref 6.0–8.5)
eGFR: 125 mL/min/{1.73_m2} (ref >59)

## 2021-09-04 LAB — CBC WITH DIFFERENTIAL/PLATELET
Basophils Absolute: 0.1 10*3/uL (ref 0.0–0.2)
Basos: 1 %
EOS (ABSOLUTE): 0.9 10*3/uL — ABNORMAL HIGH (ref 0.0–0.4)
Eos: 11 %
Hematocrit: 38.6 % (ref 34.0–46.6)
Hemoglobin: 12.8 g/dL (ref 11.1–15.9)
Immature Grans (Abs): 0.1 10*3/uL (ref 0.0–0.1)
Immature Granulocytes: 1 %
Lymphocytes Absolute: 2.2 10*3/uL (ref 0.7–3.1)
Lymphs: 27 %
MCH: 28.8 pg (ref 26.6–33.0)
MCHC: 33.2 g/dL (ref 31.5–35.7)
MCV: 87 fL (ref 79–97)
Monocytes Absolute: 0.6 10*3/uL (ref 0.1–0.9)
Monocytes: 7 %
Neutrophils Absolute: 4.3 10*3/uL (ref 1.4–7.0)
Neutrophils: 53 %
Platelets: 296 10*3/uL (ref 150–450)
RBC: 4.45 x10E6/uL (ref 3.77–5.28)
RDW: 13 % (ref 11.7–15.4)
WBC: 8.1 10*3/uL (ref 3.4–10.8)

## 2021-09-04 LAB — LIPID PANEL W/O CHOL/HDL RATIO
Cholesterol, Total: 177 mg/dL (ref 100–199)
HDL: 48 mg/dL (ref >39)
LDL Chol Calc (NIH): 110 mg/dL — ABNORMAL HIGH (ref 0–99)
Triglycerides: 104 mg/dL (ref 0–149)
VLDL Cholesterol Cal: 19 mg/dL (ref 5–40)

## 2021-09-04 LAB — TSH: TSH: 0.422 u[IU]/mL — ABNORMAL LOW (ref 0.450–4.500)

## 2021-12-03 ENCOUNTER — Ambulatory Visit: Payer: Self-pay | Admitting: Internal Medicine

## 2021-12-15 ENCOUNTER — Ambulatory Visit: Payer: Self-pay | Admitting: Internal Medicine

## 2021-12-15 ENCOUNTER — Encounter: Payer: Self-pay | Admitting: Internal Medicine

## 2021-12-15 VITALS — BP 104/70 | HR 68 | Resp 12 | Ht 61.5 in | Wt 160.0 lb

## 2021-12-15 DIAGNOSIS — H538 Other visual disturbances: Secondary | ICD-10-CM | POA: Insufficient documentation

## 2021-12-15 DIAGNOSIS — H5789 Other specified disorders of eye and adnexa: Secondary | ICD-10-CM | POA: Insufficient documentation

## 2021-12-15 DIAGNOSIS — N920 Excessive and frequent menstruation with regular cycle: Secondary | ICD-10-CM

## 2021-12-15 DIAGNOSIS — R7401 Elevation of levels of liver transaminase levels: Secondary | ICD-10-CM | POA: Insufficient documentation

## 2021-12-15 DIAGNOSIS — N946 Dysmenorrhea, unspecified: Secondary | ICD-10-CM

## 2021-12-15 DIAGNOSIS — R7989 Other specified abnormal findings of blood chemistry: Secondary | ICD-10-CM

## 2021-12-15 DIAGNOSIS — H11001 Unspecified pterygium of right eye: Secondary | ICD-10-CM | POA: Insufficient documentation

## 2021-12-15 MED ORDER — IBUPROFEN 200 MG PO TABS: ORAL_TABLET | ORAL | 0 refills | Status: AC

## 2021-12-15 NOTE — Progress Notes (Signed)
    Subjective:    Patient ID: Traci Porter, female   DOB: 08-09-1990, 31 y.o.   MRN: [id number]   HPI  Traci Porter interprets   PCOS/Dysmenorrhea:   tolerating Metformin fine.  Has been taking now about 3 months.   Her periods remain regular, but seem to becoming more painful and lasting longer.   Prior to starting Metformin, lasted 3 days.   Last month, lasted 8 days.  Started August 9th This month, started with pelvic pain 2 days before flow and flow started 12/13/2021.  She continues to have flow and it is now moderate.  This is day 3 of flow.  Describes the pain as constant and "scratching on the inside"  like scratching a wound--burning sensation.   Nothing is helping the pain--Tylenol ACETAMINOPHEN 500mg , CAFFEINE 60mg , PYRILAMINE MALEATE 15mg  (Buscapina) usually helps, but no help.   No vaginal discharge in between periods.   Does have discomfort at times in between periods with intercourse.  Has noted blood on tissue when urinates after intercourse.  No problems with lubrication with intercourse.    Current Meds  Medication Sig   Acetaminophen (ACETAMIN PO) Take 500 mg by mouth 2 (two) times daily.   metFORMIN (GLUCOPHAGE) 500 MG tablet 1 tab by mouth twice daily with meals   No Known Allergies   Review of Systems    Objective:   BP 104/70 (BP Location: Right Arm, Patient Position: Sitting, Cuff Size: Normal)   Pulse 68   Resp 12   Ht 5' 1.5" (1.562 m)   Wt 160 lb (72.6 kg)   LMP 12/13/2021   BMI 29.74 kg/m   Physical Exam NAD Neck:  Supple, No adenopathy.  Thyroid generous, but do not palpate nodule Chest:  CTA CV:  RRR without murmur or rub.  Radial pulses normal and equal Abd:  S, + BS, No HSM or mass.  Tender in bilateral lower quads and suprapubic area without rebound or peritoneal signs.   GU:  mainly thin blood with small clots in proximal vaginal canal.  Not able to completely clear away cervix, but about the os without any lesions.  No  vaginal or cervical inflammation.  Tender on palpation of uterus, with fundus lying to patient's right pelvic area.  No definitive mass.  No mass of adnexal areas, though tender bilaterally.     Assessment & Plan    PCOS with menorrhagia and dysmenorrhea:  worsening of symptoms in past 2 months.  Send for pelvic ultrasound.  If everything normal, will consider Medroxyprogesterone with next period.  To utilize ibuprofen for the pain.  Dosing given    2.  Low TSH:  repeat TSH and free T4.  Thyroid mildly enlarged.  3.  Elevated ALT:  noted in 2017 at a bit lower level.  Encouraged working on diet and physical activity.  Hepatitis screens in years past negative.  Not immune to Hep A, Hep B Core IgM checked, so not clear about immunity.

## 2021-12-17 LAB — HEPATIC FUNCTION PANEL
ALT: 27 IU/L (ref 0–32)
AST: 25 IU/L (ref 0–40)
Albumin: 4.2 g/dL (ref 3.9–4.9)
Alkaline Phosphatase: 91 IU/L (ref 44–121)
Bilirubin Total: <0.2 mg/dL (ref 0.0–1.2)
Bilirubin, Direct: <0.1 mg/dL (ref 0.00–0.40)
Total Protein: 7.2 g/dL (ref 6.0–8.5)

## 2021-12-17 LAB — TSH: TSH: 0.666 u[IU]/mL (ref 0.450–4.500)

## 2021-12-17 LAB — T4, FREE: Free T4: 1.22 ng/dL (ref 0.82–1.77)

## 2022-02-08 ENCOUNTER — Encounter: Payer: Self-pay | Admitting: Internal Medicine

## 2022-02-08 ENCOUNTER — Ambulatory Visit: Payer: Self-pay | Admitting: Internal Medicine

## 2022-02-08 ENCOUNTER — Telehealth: Payer: Self-pay

## 2022-02-08 VITALS — BP 120/70 | HR 92 | Temp 98.3°F | Resp 16 | Ht 61.5 in | Wt 159.0 lb

## 2022-02-08 DIAGNOSIS — J029 Acute pharyngitis, unspecified: Secondary | ICD-10-CM

## 2022-02-08 DIAGNOSIS — J069 Acute upper respiratory infection, unspecified: Secondary | ICD-10-CM

## 2022-02-08 DIAGNOSIS — N946 Dysmenorrhea, unspecified: Secondary | ICD-10-CM

## 2022-02-08 DIAGNOSIS — H11001 Unspecified pterygium of right eye: Secondary | ICD-10-CM

## 2022-02-08 DIAGNOSIS — N920 Excessive and frequent menstruation with regular cycle: Secondary | ICD-10-CM

## 2022-02-08 LAB — POC COVID19 BINAXNOW: SARS Coronavirus 2 Ag: NEGATIVE

## 2022-02-08 MED ORDER — PROMETHAZINE-DM 6.25-15 MG/5ML PO SYRP: 5.0000 mL | ORAL_SOLUTION | Freq: Four times a day (QID) | ORAL | 0 refills | Status: DC | PRN

## 2022-02-08 NOTE — Telephone Encounter (Signed)
Patient has been scheduled

## 2022-02-08 NOTE — Patient Instructions (Signed)
Cool mist humidifier in bedroom Drink fluids Ibuprofen 400 to 600 mg every 6 hours with food for aching, fever.

## 2022-02-08 NOTE — Telephone Encounter (Signed)
Patient would like an appointment for a sore throat, fever, and cough she has had for about 2 weeks. Patient went to the urgent care this past Saturday 02/05/2022 and was given some medications but does not know the name. Medications helped some.

## 2022-02-08 NOTE — Progress Notes (Signed)
    Subjective:    Patient ID: Traci Porter, female   DOB: [birth year], 31 y.o.   MRN: [id number]   HPI  Traci Porter interprets   Illness:  Oct 16th, started with a headache and dizziness later in day.  Body aches and sore throat started the next day--bones ached.  Developed chills and night sweats, which continue nightly.   Developed crusting of eyes with redness of eyes by end of week. No cough at the time. Tested negative for COVID with home testing on the 20th.   Went to urgent care on the 22nd.  Rapid strep negative.  COVID not rechecked Treated with promethazine-DM cough syrup, Dexamethsone burst and taper, and Ofloxacin eye drops for URI, conjunctivitis/pharyngitis and cough. States all her symptoms resolved with treatment by this past Saturday or 4 days ago and then developed cough with hoarseness the following day, Sunday or 3 days ago. Has awakened sweaty since Sunday.   Not clear if is short of breath--describes more her nasal passages being congested  Son, age 66, started with sore throat and headache yesterday, prior to this illness.  Boyfriend also at home with her.   Two to three co workers with same symptoms of headache and sore throat.  Her understanding is that they did not seek medical care and have returned to work improved.  2.  Menorrhagia/dysmenorrhea:  has not had a period nor associated pain since period in September  Current Meds  Medication Sig   ibuprofen (ADVIL) 200 MG tablet 2-4 tabletas por la boca y con comida cada 6 horas a Radiographer, therapeutic.   metFORMIN (GLUCOPHAGE) 500 MG tablet 1 tab by mouth twice daily with meals   No Known Allergies   Review of Systems    Objective:   BP 120/70 (BP Location: Left Arm, Patient Position: Sitting, Cuff Size: Normal)   Pulse 92   Temp 98.3 F (36.8 C)   Resp 16   Ht 5' 1.5" (1.562 m)   Wt 159 lb (72.1 kg)   LMP 12/13/2021 (Exact Date)   BMI 29.56 kg/m   Physical Exam NAD HEENT:  PERRL,  EOMI, conjunctivae without injection save for mild injection of nasal pterygium of right eye.  TMs pearly gray, throat without injection or exudate. Nasal mucosa swollen with mild erythema.  Clear discharge Neck:  Supple, No adenopathy Chest:  CTA CV:  RRR without murmur or rub.  Radial pulses normal and equal. Abd:  S, NT, No HSM or mass. + BS Skin:  no rash  COVID POCT:  negative Assessment & Plan   Viral URI:  push fluids, cool mist humidifier.  Ibuprofen for sore throat, fever, aches.  Promethazine DM for cough.  Call if worsen.    2.  Menorrhagia with Dysmenorrhea:  No period since last seen in Sept.  Has not had pelvic US.  3.  Right pterygium.

## 2022-03-16 ENCOUNTER — Ambulatory Visit: Payer: Self-pay | Admitting: Internal Medicine

## 2022-05-03 ENCOUNTER — Ambulatory Visit: Payer: Self-pay | Admitting: Internal Medicine

## 2022-10-20 ENCOUNTER — Telehealth: Payer: Self-pay

## 2022-10-20 NOTE — Telephone Encounter (Signed)
Patient would like an appointment for period spotting. Patient reports that she has been spotting for almost a month. We will call once an appointment becomes available.

## 2022-10-25 NOTE — Telephone Encounter (Signed)
Patient has been scheduled

## 2022-10-27 ENCOUNTER — Encounter: Payer: Self-pay | Admitting: Internal Medicine

## 2022-10-27 ENCOUNTER — Ambulatory Visit: Payer: Self-pay | Admitting: Internal Medicine

## 2022-10-27 VITALS — BP 116/64 | HR 67 | Resp 16 | Ht 61.5 in | Wt 170.0 lb

## 2022-10-27 DIAGNOSIS — N912 Amenorrhea, unspecified: Secondary | ICD-10-CM

## 2022-10-27 DIAGNOSIS — E282 Polycystic ovarian syndrome: Secondary | ICD-10-CM

## 2022-10-27 LAB — POCT URINE PREGNANCY: Preg Test, Ur: NEGATIVE

## 2022-10-27 MED ORDER — METFORMIN HCL ER 500 MG PO TB24: 500.0000 mg | ORAL_TABLET | Freq: Every day | ORAL | 11 refills | Status: DC

## 2022-10-27 MED ORDER — MEDROXYPROGESTERONE ACETATE 10 MG PO TABS: ORAL_TABLET | ORAL | 0 refills | Status: DC

## 2022-10-27 NOTE — Progress Notes (Signed)
    Subjective:    Patient ID: Traci Porter, female   DOB: [birth year], 32 y.o.   MRN: [id number]   HPI  Tereasa Coop interprets   Abnormal Uterine Bleeding:  Today, she shares for first time that she took Metformin for about 1 month last May and then stopped as she felt the medication was causing a headache. She was not, in fact, taking the Metformin in September when she said her period was more regular, but longer.   She had what she feels was a normal period in February.  Lasted 3 days. She did not have a period March through May. Last week in June, she had just a bit of spotting and has continued to spot since.     Current Meds  Medication Sig   ibuprofen (ADVIL) 200 MG tablet 2-4 tabletas por la boca y con comida cada 6 horas a Biomedical engineer.   No Known Allergies   Review of Systems    Objective:   BP 116/64 (BP Location: Right Arm, Patient Position: Sitting, Cuff Size: Normal)   Pulse 67   Resp 16   Ht 5' 1.5" (1.562 m)   Wt 170 lb (77.1 kg)   LMP 05/12/2022 (Approximate) Comment: Last normal menstrual period.  BMI 31.60 kg/m   Physical Exam Lungs:  CTA CV: RRR without murmur or rub.   ABd:  S, mild RLQ tenderness.  No HSM or mass,   Assessment & Plan    Irregular menses with PCOS:  She is willing to restart MetforminXR 500 mg and take only once daily.  Will see if extended release is without symptoms.  Also, to start Medroxy progesterone 10 mg daily for 10 day course to stop the extended bleeding.  Checking urine HCG first before starting med.

## 2023-02-28 ENCOUNTER — Encounter: Payer: Self-pay | Admitting: Internal Medicine

## 2023-02-28 ENCOUNTER — Ambulatory Visit: Payer: Self-pay | Admitting: Internal Medicine

## 2023-02-28 VITALS — BP 136/88 | HR 67 | Resp 16 | Ht 61.5 in | Wt 169.0 lb

## 2023-02-28 DIAGNOSIS — N912 Amenorrhea, unspecified: Secondary | ICD-10-CM

## 2023-02-28 DIAGNOSIS — E66811 Obesity, class 1: Secondary | ICD-10-CM

## 2023-02-28 DIAGNOSIS — Z23 Encounter for immunization: Secondary | ICD-10-CM

## 2023-02-28 DIAGNOSIS — E282 Polycystic ovarian syndrome: Secondary | ICD-10-CM

## 2023-02-28 NOTE — Progress Notes (Signed)
    Subjective:    Patient ID: Traci Porter, female   DOB: Mar 18, 1991, 32 y.o.   MRN: [id number]   HPI  PCOS and amenorrhea:  Tolerating XR version of Metformin fine.  She is only taking once daily as well.  She is having period monthly now, though last month was just spotting.  She has not really kept track as to whether they are close to being about the same time each month.  Does sound like she is generally within a week of where she had her period the month before.    She is 10 lbs more than she weighed a year ago. She is not really working on diet or physical activity to get her weight down.  Discussed she is in the obese range with her height and weight.    HM:  no interest in COVID vaccine--does not want to discuss.   After discussion, will go ahead with influenza vaccine.    Current Meds  Medication Sig   ibuprofen (ADVIL) 200 MG tablet 2-4 tabletas por la boca y con comida cada 6 horas a Biomedical engineer.   metFORMIN (GLUCOPHAGE-XR) 500 MG 24 hr tablet Take 1 tablet (500 mg total) by mouth daily with breakfast.   No Known Allergies   Review of Systems    Objective:   BP 136/88 (BP Location: Left Arm, Patient Position: Sitting, Cuff Size: Normal)   Pulse 67   Resp 16   Ht 5' 1.5" (1.562 m)   Wt 169 lb (76.7 kg)   LMP 02/20/2023 (Exact Date)   BMI 31.42 kg/m   Physical Exam NAD HEENT:  PERRL, EOMI Neck: Supple, No adenopathy Chest:  CTA CV:  RRR without murmur or rub.  Radial and DP pulses normal and equal. Abd:  S, NT, No HSM or mass, + BS LE:  No edema.     Assessment & Plan  PCOS with irregular periods:  Improved regularity and length of period now with once daily Metformin.  Concerned with continued weight gaing.  2.  Obesity:  encouraged making weekly goals for dietary and physical activity goals.  A1C  3.  HM:  declines COVID vaccination.  Influenza vaccine today.

## 2023-03-01 LAB — CBC WITH DIFFERENTIAL/PLATELET
Basophils Absolute: 0 10*3/uL (ref 0.0–0.2)
Basos: 1 %
EOS (ABSOLUTE): 0.5 10*3/uL — ABNORMAL HIGH (ref 0.0–0.4)
Eos: 6 %
Hematocrit: 44.3 % (ref 34.0–46.6)
Hemoglobin: 14.5 g/dL (ref 11.1–15.9)
Immature Grans (Abs): 0 10*3/uL (ref 0.0–0.1)
Immature Granulocytes: 0 %
Lymphocytes Absolute: 1.8 10*3/uL (ref 0.7–3.1)
Lymphs: 22 %
MCH: 29.5 pg (ref 26.6–33.0)
MCHC: 32.7 g/dL (ref 31.5–35.7)
MCV: 90 fL (ref 79–97)
Monocytes Absolute: 0.7 10*3/uL (ref 0.1–0.9)
Monocytes: 8 %
Neutrophils Absolute: 5.2 10*3/uL (ref 1.4–7.0)
Neutrophils: 63 %
Platelets: 315 10*3/uL (ref 150–450)
RBC: 4.91 x10E6/uL (ref 3.77–5.28)
RDW: 13.1 % (ref 11.7–15.4)
WBC: 8.2 10*3/uL (ref 3.4–10.8)

## 2023-03-01 LAB — LIPID PANEL W/O CHOL/HDL RATIO
Cholesterol, Total: 181 mg/dL (ref 100–199)
HDL: 40 mg/dL (ref >39)
LDL Chol Calc (NIH): 88 mg/dL (ref 0–99)
Triglycerides: 319 mg/dL — ABNORMAL HIGH (ref 0–149)
VLDL Cholesterol Cal: 53 mg/dL — ABNORMAL HIGH (ref 5–40)

## 2023-03-01 LAB — COMPREHENSIVE METABOLIC PANEL
ALT: 56 [IU]/L — ABNORMAL HIGH (ref 0–32)
AST: 44 [IU]/L — ABNORMAL HIGH (ref 0–40)
Albumin: 4.3 g/dL (ref 3.9–4.9)
Alkaline Phosphatase: 119 [IU]/L (ref 44–121)
BUN/Creatinine Ratio: 15 (ref 9–23)
BUN: 9 mg/dL (ref 6–20)
Bilirubin Total: 0.2 mg/dL (ref 0.0–1.2)
CO2: 23 mmol/L (ref 20–29)
Calcium: 9.4 mg/dL (ref 8.7–10.2)
Chloride: 104 mmol/L (ref 96–106)
Creatinine, Ser: 0.6 mg/dL (ref 0.57–1.00)
Globulin, Total: 3.3 g/dL (ref 1.5–4.5)
Glucose: 93 mg/dL (ref 70–99)
Potassium: 4.2 mmol/L (ref 3.5–5.2)
Sodium: 141 mmol/L (ref 134–144)
Total Protein: 7.6 g/dL (ref 6.0–8.5)
eGFR: 121 mL/min/{1.73_m2} (ref >59)

## 2023-03-01 LAB — HGB A1C W/O EAG: Hgb A1c MFr Bld: 5.5 % (ref 4.8–5.6)

## 2023-06-22 ENCOUNTER — Other Ambulatory Visit: Payer: Self-pay

## 2023-06-22 DIAGNOSIS — R7401 Elevation of levels of liver transaminase levels: Secondary | ICD-10-CM

## 2023-06-23 LAB — HEPATITIS B SURFACE ANTIBODY,QUALITATIVE: Hep B Surface Ab, Qual: NONREACTIVE

## 2023-06-23 LAB — HEPATIC FUNCTION PANEL
ALT: 31 IU/L (ref 0–32)
AST: 29 IU/L (ref 0–40)
Albumin: 4.3 g/dL (ref 3.9–4.9)
Alkaline Phosphatase: 89 IU/L (ref 44–121)
Bilirubin Total: 0.4 mg/dL (ref 0.0–1.2)
Bilirubin, Direct: 0.14 mg/dL (ref 0.00–0.40)
Total Protein: 7.1 g/dL (ref 6.0–8.5)

## 2023-06-23 LAB — HEPATITIS A ANTIBODY, TOTAL: hep A Total Ab: POSITIVE — AB

## 2023-06-23 LAB — HEPATITIS B CORE ANTIBODY, TOTAL: Hep B Core Total Ab: NEGATIVE

## 2023-06-30 ENCOUNTER — Ambulatory Visit: Payer: Self-pay

## 2023-06-30 DIAGNOSIS — Z23 Encounter for immunization: Secondary | ICD-10-CM

## 2023-08-28 ENCOUNTER — Encounter: Payer: Self-pay | Admitting: Internal Medicine

## 2023-08-29 ENCOUNTER — Ambulatory Visit: Payer: Self-pay | Admitting: Internal Medicine

## 2023-08-29 ENCOUNTER — Encounter: Payer: Self-pay | Admitting: Internal Medicine

## 2023-08-29 VITALS — BP 110/70 | HR 72 | Resp 12 | Ht 61.5 in | Wt 156.0 lb

## 2023-08-29 DIAGNOSIS — Z630 Problems in relationship with spouse or partner: Secondary | ICD-10-CM

## 2023-08-29 DIAGNOSIS — Z23 Encounter for immunization: Secondary | ICD-10-CM

## 2023-08-29 DIAGNOSIS — E781 Pure hyperglyceridemia: Secondary | ICD-10-CM

## 2023-08-29 DIAGNOSIS — Z0000 Encounter for general adult medical examination without abnormal findings: Secondary | ICD-10-CM

## 2023-08-29 DIAGNOSIS — N946 Dysmenorrhea, unspecified: Secondary | ICD-10-CM

## 2023-08-29 DIAGNOSIS — F1090 Alcohol use, unspecified, uncomplicated: Secondary | ICD-10-CM

## 2023-08-29 DIAGNOSIS — E282 Polycystic ovarian syndrome: Secondary | ICD-10-CM

## 2023-08-29 MED ORDER — NORGESTIMATE-ETH ESTRADIOL 0.25-35 MG-MCG PO TABS: 1.0000 | ORAL_TABLET | Freq: Every day | ORAL | 11 refills | Status: AC

## 2023-08-29 NOTE — Progress Notes (Signed)
 Subjective:    Patient ID: Traci Porter, female   DOB: 1991/01/03, 33 y.o.   MRN: [id number]   HPI  Traci Porter interprets  CPE without pap  1.  Pap:  Last in 2023 and normal.   Had ASCUS without high risk HPV.  Normalized in 2022 with recheck.    2.  Mammogram:  Mammogram and US  in 2022 with findings of cyst on right--aspirated under US  guidance without recurrence  Has not noted any abnormality on self breast exam.  No family history of breast cancer.    3.  Osteoprevention:  3 servings of cheese and yogurt daily.  Has milk on cereal maybe 3 times weekly.  Walks on weekends.  Zumba 3 times weekly.    4.  Guaiac Cards/FIT:  Never.    5.  Colonoscopy:  Never.  No family history of colon cancer.    6.  Immunizations:  Has not received any COVID vaccines.  Had one Hep A vaccination when age 54 from [place] but she is not sure if she received another somewhere else or as younger child.  + IgG for Hep A in March of this year. Was negative for Hep B S Ab in March, despite vaccination as child.  Restarted 3 shots also in March.  Due for 2nd today. Immunization History  Administered Date(s) Administered   BCG 12/31/1989   DTP 10/13/1989, 11/15/1989, 01/16/1990, 08/17/1990, 10/04/1990, 10/13/1993   HPV Quadrivalent 11/29/2007   Hep B, Unspecified 01/06/2003, 02/03/2003, 05/12/2003   Hepatitis A, Ped/Adol-2 Dose 11/29/2007   Hepatitis B, ADULT 06/30/2023   Influenza Split 12/30/2011   Influenza Whole 01/20/2009   Influenza, Mdck, Trivalent,PF 6+ MOS(egg free) 02/28/2023   Influenza,inj,Quad PF,6+ Mos 01/20/2014, 01/23/2015, 02/25/2016, 06/09/2017, 06/11/2018   MMR 11/11/1996, 11/29/2007   Measles 05/24/1990, 03/15/1994, 08/07/1994   Meningococcal Conjugate 11/29/2007   OPV 10/13/1989, 11/15/1989, 01/16/1990, 10/04/1990, 10/13/1993   Td 01/06/2003, 11/30/2009   Tdap 11/29/2007, 09/03/2021   Varicella 02/03/2003, 11/29/2007     7.  Glucose/Cholesterol: A1C was 5.5%  in November.  She carries dx of PCOS and takes Metformin .  Blood sugars have been fine. Cholesterol high normal with elevated trigs in November Lipid Panel     Component Value Date/Time   CHOL 181 02/28/2023 1716   TRIG 319 (H) 02/28/2023 1716   HDL 40 02/28/2023 1716   CHOLHDL 3.5 06/12/2018 0838   CHOLHDL 2.4 06/24/2014 0945   VLDL 23 06/24/2014 0945   LDLCALC 88 02/28/2023 1716   LABVLDL 53 (H) 02/28/2023 1716     8.  Other:  Elevated liver enzymes:  resolved with labs 06/22/2023.      Current Meds  Medication Sig   ibuprofen  (ADVIL ) 200 MG tablet 2-4 tabletas por la boca y con comida cada 6 horas a Biomedical engineer.   metFORMIN  (GLUCOPHAGE -XR) 500 MG 24 hr tablet Take 1 tablet (500 mg total) by mouth daily with breakfast.   No Known Allergies  Past Medical History:  Diagnosis Date   Chronic constipation    Dysmenorrhea    Hypertriglyceridemia without hypercholesterolemia    Infertility associated with anovulation    PCOS (polycystic ovarian syndrome) 03/08/2016   Ultrasound 03/01/2016 IMPRESSION: 1. No acute findings identified 2. Increased volume of both ovaries. In the absence of oral contraceptive therapy findings meet diagnostic criteria for polycystic ovarian syndrome (PCOS). This is Per 2003 consensus conference statement by the European Society for Human Reproduction and Embryology and the [employer]  Past Surgical History:  Procedure Laterality Date   implanon removed  03/11/2009   right breast cyst aspiration Right 2022   Family History  Problem Relation Age of Onset   Alcohol abuse Father        not clear if cirrhosis as cause of death   Family Status  Relation Name Status   Mother  Alive, age 79y   Father  Deceased       never really knew him as parents separated when she was an infant.   Brother  Deceased at age 4       Killed by gangs [place]   Brother  Alive   Brother  Alive   Brother  Alive   Brother   Alive   Son Traci Porter, age 17y  No partnership data on file   Social History   Socioeconomic History   Marital status: Media planner    Spouse name: Traci Porter   Number of children: 1   Years of education: Not on file   Highest education level: 10th grade  Occupational History   Occupation: Nut, chip, and Haematologist.    Comment: [employer]  Tobacco Use   Smoking status: Never    Passive exposure: Never   Smokeless tobacco: Never  Vaping Use   Vaping status: Never Used  Substance and Sexual Activity   Alcohol use: Not Currently    Comment: 6 beers daily or 42 weekly.   Drug use: No   Sexual activity: Yes    Partners: Male    Birth control/protection: None  Other Topics Concern   Not on file  Social History Narrative   Traci Porter is mother--moved to the area in 2004 to be near her   Catholic faith.    Lives female partner, Traci Porter and with Son Traci Porter   FOB not involved.                   Social Drivers of Corporate investment banker Strain: Low Risk  (08/29/2023)   Overall Financial Resource Strain (CARDIA)    Difficulty of Paying Living Expenses: Not very hard  Food Insecurity: No Food Insecurity (08/29/2023)   Hunger Vital Sign    Worried About Running Out of Food in the Last Year: Never true    Ran Out of Food in the Last Year: Never true  Transportation Needs: No Transportation Needs (08/29/2023)   PRAPARE - Administrator, Civil Service (Medical): No    Lack of Transportation (Non-Medical): No  Physical Activity: Not on file  Stress: Not on file  Social Connections: Not on file  Intimate Partner Violence: Not At Risk (08/29/2023)   Humiliation, Afraid, Rape, and Kick questionnaire    Fear of Current or Ex-Partner: No    Emotionally Abused: No    Physically Abused: No    Sexually Abused: No      Review of Systems  Genitourinary:  Positive for menstrual problem (periods now regular, but still  with dysmenorrhea).  Psychiatric/Behavioral:         She is having problems with partner again with pressure to drink also again.  She is drinking 6 beers daily.   She has not had counseling.        Objective:   BP 110/70 (BP Location: Right Arm, Patient Position: Sitting, Cuff Size: Normal)   Pulse 72   Resp 12   Ht 5' 1.5 (1.562 m)  Wt 156 lb (70.8 kg)   LMP 07/17/2023 (Exact Date)   BMI 29.00 kg/m   Physical Exam HENT:     Head: Normocephalic and atraumatic.     Right Ear: Tympanic membrane, ear canal and external ear normal.     Left Ear: Tympanic membrane, ear canal and external ear normal.     Nose: Nose normal.     Mouth/Throat:     Mouth: Mucous membranes are dry.     Pharynx: Oropharynx is clear.  Eyes:     Extraocular Movements: Extraocular movements intact.     Conjunctiva/sclera: Conjunctivae normal.     Pupils: Pupils are equal, round, and reactive to light.     Comments: Discs sharp  Neck:     Thyroid: No thyroid mass or thyromegaly.  Cardiovascular:     Rate and Rhythm: Normal rate and regular rhythm.     Heart sounds: S1 normal and S2 normal. No murmur heard.    No friction rub. No S3 or S4 sounds.     Comments: Carotid, radial, femoral, DP and PT pulses normal and equal.   Pulmonary:     Effort: Pulmonary effort is normal.     Breath sounds: Normal breath sounds and air entry.  Chest:  Breasts:    Right: No inverted nipple or nipple discharge.     Left: No inverted nipple, mass or nipple discharge.     Comments: Right upper outer quadrant with generalize lumpiness, but no focal mass Abdominal:     General: Bowel sounds are normal.     Palpations: Abdomen is soft. There is no hepatomegaly, splenomegaly or mass.     Tenderness: There is no abdominal tenderness.     Hernia: No hernia is present.  Genitourinary:    Comments: Normal external female genitalia No uterine or adnexal mass or tenderness. Musculoskeletal:        General: Normal range  of motion.     Cervical back: Normal range of motion and neck supple.     Right lower leg: No edema.     Left lower leg: No edema.  Lymphadenopathy:     Head:     Right side of head: No submental or submandibular adenopathy.     Left side of head: No submental or submandibular adenopathy.     Cervical: No cervical adenopathy.     Upper Body:     Right upper body: No supraclavicular or axillary adenopathy.     Left upper body: No supraclavicular or axillary adenopathy.     Lower Body: No right inguinal adenopathy. No left inguinal adenopathy.  Skin:    General: Skin is warm.     Capillary Refill: Capillary refill takes less than 2 seconds.     Findings: No rash.  Neurological:     General: No focal deficit present.     Mental Status: She is alert and oriented to person, place, and time.     Cranial Nerves: Cranial nerves 2-12 are intact.     Sensory: Sensation is intact.     Motor: Motor function is intact.     Coordination: Coordination is intact.     Gait: Gait is intact.     Deep Tendon Reflexes: Reflexes are normal and symmetric.  Psychiatric:        Speech: Speech normal.        Behavior: Behavior normal. Behavior is cooperative.      Assessment & Plan   CPE without pap  2.  Non immunity to Hep B despite previous vaccination.  2nd Hep B for repeat vaccination.  2nd Hep A today as well as received only one in 2009.  3.  PCOS and dysmenorrhea:  Sprintec.    4.  Relationship issues and alcohol use disorder:  history of elevated liver enzymes with alcohol use, which recently resolved.  Referral to [facility] for counseling.    5.  Elevated triglycerides:  FLP in 1 week.

## 2023-09-08 ENCOUNTER — Other Ambulatory Visit: Payer: Self-pay

## 2023-09-08 DIAGNOSIS — E781 Pure hyperglyceridemia: Secondary | ICD-10-CM

## 2023-09-09 LAB — LIPID PANEL W/O CHOL/HDL RATIO
Cholesterol, Total: 158 mg/dL (ref 100–199)
HDL: 55 mg/dL (ref >39)
LDL Chol Calc (NIH): 72 mg/dL (ref 0–99)
Triglycerides: 187 mg/dL — ABNORMAL HIGH (ref 0–149)
VLDL Cholesterol Cal: 31 mg/dL (ref 5–40)

## 2023-09-17 ENCOUNTER — Ambulatory Visit: Payer: Self-pay | Admitting: Internal Medicine

## 2023-10-02 ENCOUNTER — Ambulatory Visit: Payer: Self-pay

## 2023-10-24 ENCOUNTER — Telehealth: Payer: Self-pay | Admitting: Internal Medicine

## 2023-10-24 NOTE — Telephone Encounter (Signed)
 Patient would like to be on wait list for appointment Patient states two weeks ago she felt pain on her lower abdomen  what patient thought she was going to start her menstrual cycle but patient did not start her cycle and ever since then pain has stay there . Patient states she feels pain more towards her left side , patient describes pain feels like stinging , Patient states that  while having intercourse she has pain and bleeds. After patient notice that if she goes to the bathroom to urine when she cleans and notices blood on toilet paper, but does not stain underwear.  We will call patient when we have a cancellation.

## 2023-10-31 NOTE — Telephone Encounter (Signed)
Called patient to offer appointment, patient did not answer.

## 2023-11-01 NOTE — Telephone Encounter (Signed)
 Patient has been scheduled

## 2023-11-02 ENCOUNTER — Ambulatory Visit: Payer: Self-pay | Admitting: Internal Medicine

## 2023-11-02 VITALS — BP 118/80 | HR 76 | Resp 20 | Ht 61.5 in | Wt 154.0 lb

## 2023-11-02 DIAGNOSIS — R1030 Lower abdominal pain, unspecified: Secondary | ICD-10-CM

## 2023-11-02 DIAGNOSIS — N898 Other specified noninflammatory disorders of vagina: Secondary | ICD-10-CM

## 2023-11-02 DIAGNOSIS — R350 Frequency of micturition: Secondary | ICD-10-CM

## 2023-11-02 LAB — POCT URINALYSIS DIPSTICK
Bilirubin, UA: NEGATIVE
Glucose, UA: NEGATIVE
Nitrite, UA: NEGATIVE
Protein, UA: NEGATIVE
Spec Grav, UA: >=1.03 — AB (ref 1.010–1.025)
Urobilinogen, UA: 0.2 U/dL
pH, UA: 6 (ref 5.0–8.0)

## 2023-11-02 LAB — POCT URINE PREGNANCY: Preg Test, Ur: NEGATIVE

## 2023-11-02 LAB — POCT WET PREP WITH KOH
KOH Prep POC: NEGATIVE
RBC Wet Prep HPF POC: NEGATIVE
Trichomonas, UA: NEGATIVE
Yeast Wet Prep HPF POC: NEGATIVE

## 2023-11-02 MED ORDER — METRONIDAZOLE 500 MG PO TABS: ORAL_TABLET | ORAL | 0 refills | Status: DC

## 2023-11-02 NOTE — Progress Notes (Signed)
      Subjective:    Patient ID: Traci Porter, female   DOB: 11-24-1990, 33 y.o.   MRN: [id number]   HPI  Erminio Bloomer inteprets   Bilateral lower abdominal pain, worse on left.  Has been a problem for 3 weeks.  Discomfort is constant, but worse at times--similar to menstrual cramps.   She has had consistently regular periods when taking BCPs without color.   She feels she has urinary frequency and urge, but unable to urinate enough.   No dysuria.   She has noted a strong odor from genital area since her period last week. She is having vaginal discharge that is clear, but thicker than water.  Started when she finished her period last week.  Fishy odor.   She also is having discomfort with intercourse.  Started same time as lower abdominal pain. She lives with her boyfriend.   She cannot say if he is monogamous with her.    Current Meds  Medication Sig   ibuprofen  (ADVIL ) 200 MG tablet 2-4 tabletas por la boca y con comida cada 6 horas a Biomedical engineer.   metFORMIN  (GLUCOPHAGE -XR) 500 MG 24 hr tablet Take 1 tablet (500 mg total) by mouth daily with breakfast.   norgestimate -ethinyl estradiol  (ORTHO-CYCLEN) 0.25-35 MG-MCG tablet Take 1 tablet by mouth daily.   olopatadine  (PATADAY ) 0.1 % ophthalmic solution Place 1 drop into the right eye 2 (two) times daily.   No Known Allergies   Review of Systems    Objective:   BP 118/80 (BP Location: Left Arm, Patient Position: Sitting, Cuff Size: Normal)   Pulse 76   Resp 20   Ht 5' 1.5 (1.562 m)   Wt 154 lb (69.9 kg)   LMP 10/26/2023 (Exact Date)   BMI 28.63 kg/m   Physical Exam NAD Lungs:  CTA CV:  RRR without murmur or rub.   Abd:  S, + BS, no HSM or mass.  Mildly tender without rebound or peritoneal signs across entire lower abdomen, LLQ most significantly.  No flank tenderness.   GU:  Normal external female genitalia.  Mild light yellow vaginal discharge without obvioius inflammation of cervix.  No CMT.  Tender on  palpation of right adnexa and uterus, but no bogginess, mass or enlargement.  More tender over left adnexa, but again no mass.   Assessment & Plan   Bilateral lower quadrant pain with urinary frequency, dyspareunia and vaginal discharge:  HIV, RPR, GC/chlamydia, UA, Urine pregnancy, Wet prep. Appears to at least have BV, so will treat with Metronidazole  500 mg twice daily for 7 days while awaiting return of labs.

## 2023-11-03 ENCOUNTER — Ambulatory Visit: Payer: Self-pay | Admitting: Internal Medicine

## 2023-11-03 LAB — HIV ANTIBODY (ROUTINE TESTING W REFLEX): HIV Screen 4th Generation wRfx: NONREACTIVE

## 2023-11-03 LAB — RPR QUALITATIVE: RPR Ser Ql: NONREACTIVE

## 2023-11-04 ENCOUNTER — Encounter: Payer: Self-pay | Admitting: Internal Medicine

## 2023-11-04 LAB — URINE CULTURE

## 2023-11-06 LAB — GC/CHLAMYDIA PROBE AMP
Chlamydia trachomatis, NAA: NEGATIVE
Neisseria Gonorrhoeae by PCR: NEGATIVE

## 2023-11-10 ENCOUNTER — Telehealth: Payer: Self-pay | Admitting: Internal Medicine

## 2023-11-10 NOTE — Telephone Encounter (Signed)
 Patient reports no fever and no redness. Patient reports that the area feels warm and clear fluid comes out of her nipple when pressing the area.   Per Dr Adella, patient can do warm compressions to her breast. Can continue to press on the bump to push fluid out. If pain continues, if develops a fever, if discharge changes color then patient can call the clinic to report.

## 2023-11-10 NOTE — Telephone Encounter (Signed)
 Patient states she needs an appointment for patient states she has pain on her right breast and patient states due to her pain she started to feel  her breast to see if she had any bump and patient  notice when pressing breast there was white discharge coming out . Patient states pain started Wednesday , discharge only happens if she press her breast.

## 2023-11-10 NOTE — Telephone Encounter (Signed)
 Will ask if there if breast has redness, where the discharge is coming out of, if she has a fever.

## 2023-12-05 ENCOUNTER — Telehealth: Payer: Self-pay | Admitting: Psychology

## 2023-12-31 ENCOUNTER — Telehealth: Payer: Self-pay | Admitting: Internal Medicine

## 2024-01-01 ENCOUNTER — Ambulatory Visit: Payer: Self-pay | Admitting: Internal Medicine

## 2024-01-01 NOTE — Telephone Encounter (Signed)
 Called patient to reschedule. Patient did not answer, left a voicemail asking to return call.

## 2024-01-19 ENCOUNTER — Telehealth: Payer: Self-pay | Admitting: Internal Medicine

## 2024-01-19 NOTE — Telephone Encounter (Signed)
 Spoke with patient , patient states symptoms resolved after taking Metronidazole , however she called to be on wait list for menstrual cycle concerns. Patient is currently on wait list for an appointment.

## 2024-01-19 NOTE — Telephone Encounter (Signed)
 Patient needs an appointment for menstrual cycle concerns. Patient states she started her period last month on September 12 and it stopped on September 16.  Patient states she then started her period again on September 25 and has been having cycle since then . Patient reports bleeding is regular but there is times where is heavy then it goes back to regular.     Patient also needs a refill for medication   metFORMIN  (GLUCOPHAGE -XR) 500 MG 24 hr tablet [[id number]

## 2024-01-22 NOTE — Telephone Encounter (Signed)
 Called patient no answer and unable to lvm

## 2024-01-26 NOTE — Telephone Encounter (Signed)
 Offered patient an appointment, patient unable to make it.  Patient would like an afternoon appointment,

## 2024-02-08 NOTE — Telephone Encounter (Signed)
Called patient to offer appointment, patient did not answer.

## 2024-02-26 NOTE — Telephone Encounter (Signed)
 Patient has been scheduled

## 2024-02-27 ENCOUNTER — Ambulatory Visit: Payer: Self-pay | Admitting: Internal Medicine

## 2024-04-01 ENCOUNTER — Ambulatory Visit: Payer: Self-pay | Admitting: Internal Medicine

## 2024-04-09 ENCOUNTER — Telehealth: Payer: Self-pay | Admitting: Internal Medicine

## 2024-04-09 NOTE — Telephone Encounter (Signed)
 Patient is requesting refill for medication   metFORMIN  (GLUCOPHAGE -XR) 500 MG 24 hr tablet [[id number]]

## 2024-04-10 ENCOUNTER — Telehealth: Payer: Self-pay | Admitting: Internal Medicine

## 2024-04-10 MED ORDER — METFORMIN HCL ER 500 MG PO TB24: 500.0000 mg | ORAL_TABLET | Freq: Every day | ORAL | 11 refills | Status: AC

## 2024-04-10 NOTE — Telephone Encounter (Signed)
 Patient is requesting medication refill for   metFORMIN  (GLUCOPHAGE -XR) 500 MG 24 hr tablet [[id number]]

## 2024-04-10 NOTE — Telephone Encounter (Signed)
 Patient has been notified

## 2024-04-10 NOTE — Telephone Encounter (Signed)
 Please notify her that metfomin was sent

## 2024-05-16 ENCOUNTER — Encounter: Payer: Self-pay | Admitting: Internal Medicine

## 2024-05-16 ENCOUNTER — Ambulatory Visit: Payer: Self-pay | Admitting: Internal Medicine

## 2024-05-16 ENCOUNTER — Other Ambulatory Visit: Payer: Self-pay | Admitting: Internal Medicine

## 2024-05-16 VITALS — BP 130/80 | HR 72 | Resp 20 | Ht 61.5 in | Wt 155.0 lb

## 2024-05-16 DIAGNOSIS — N920 Excessive and frequent menstruation with regular cycle: Secondary | ICD-10-CM

## 2024-05-16 DIAGNOSIS — Z124 Encounter for screening for malignant neoplasm of cervix: Secondary | ICD-10-CM

## 2024-05-16 DIAGNOSIS — R102 Pelvic and perineal pain unspecified side: Secondary | ICD-10-CM

## 2024-05-16 DIAGNOSIS — Z23 Encounter for immunization: Secondary | ICD-10-CM

## 2024-05-16 DIAGNOSIS — N644 Mastodynia: Secondary | ICD-10-CM

## 2024-05-16 DIAGNOSIS — N6452 Nipple discharge: Secondary | ICD-10-CM

## 2024-05-16 LAB — POCT URINALYSIS DIPSTICK
Bilirubin, UA: NEGATIVE
Blood, UA: NEGATIVE
Glucose, UA: NEGATIVE
Ketones, UA: NEGATIVE
Leukocytes, UA: NEGATIVE
Nitrite, UA: NEGATIVE
Protein, UA: POSITIVE — AB
Spec Grav, UA: 1.01
Urobilinogen, UA: 1 U/dL
pH, UA: 7.5

## 2024-05-16 LAB — POCT WET PREP WITH KOH
RBC Wet Prep HPF POC: NEGATIVE
Trichomonas, UA: NEGATIVE
Yeast Wet Prep HPF POC: NEGATIVE

## 2024-05-16 MED ORDER — METRONIDAZOLE 500 MG PO TABS: 500.0000 mg | ORAL_TABLET | Freq: Two times a day (BID) | ORAL | 0 refills | Status: AC

## 2024-05-16 NOTE — Progress Notes (Unsigned)
" ° ° ° ° ° °  Subjective:    Patient ID: Traci Porter, female   DOB: 29-Aug-1990, 34 y.o.   MRN: [id number]   HPI  Erminio Bloomer interprets  Bilateral pelvic pain:  Cramping type pain, similar to that in July.   Does state that after treating for BV with metronidazole , her symptoms resolved.    On a daily basis, really just bothers her when she bends over.   Started up again in October on a daily basis and then in December and January, had heavy periods with clots.   Periods occurred at the right time during BCP no hormone week.   Lasted 5 days each time.   She states the pain was much worse with these periods. No vaginal discharge. No odor. Increased pain also with intercourse and can bleed afterwards--small amount, just on toilet paper.   No fevers.   Married.  Husband without any penile discharge or testicular pain  2.  Right nipple with clear discharge.   For about 1 month No swelling or redness. Sometimes stings and breast can feel hot Nipple can itch Her bras do not have seems that go across the nipple She only has noted the discharge when she palpates her breast, which she performs 2-3 times weekly.      Current Meds  Medication Sig   metFORMIN  (GLUCOPHAGE -XR) 500 MG 24 hr tablet Take 1 tablet (500 mg total) by mouth daily with breakfast.   norgestimate -ethinyl estradiol  (ORTHO-CYCLEN) 0.25-35 MG-MCG tablet Take 1 tablet by mouth daily.   No Known Allergies   Review of Systems    Objective:   BP 130/80   Pulse 72   Resp 20   Ht 5' 1.5 (1.562 m)   Wt 155 lb (70.3 kg)   LMP 04/20/2024 (Exact Date)   BMI 28.81 kg/m   Physical Exam   Assessment & Plan   "

## 2024-05-17 LAB — CYTOLOGY - PAP

## 2024-09-16 ENCOUNTER — Ambulatory Visit: Payer: Self-pay | Admitting: Internal Medicine
